# Patient Record
Sex: Female | Born: 1935 | Race: White | Hispanic: No | Smoking: Never smoker
Health system: Southern US, Community
[De-identification: ages and names within clinical notes are randomized; demographics above are authoritative.]

## PROBLEM LIST (undated history)

## (undated) DIAGNOSIS — H409 Unspecified glaucoma: Secondary | ICD-10-CM

## (undated) DIAGNOSIS — R1310 Dysphagia, unspecified: Secondary | ICD-10-CM

## (undated) DIAGNOSIS — M1990 Unspecified osteoarthritis, unspecified site: Secondary | ICD-10-CM

## (undated) DIAGNOSIS — E039 Hypothyroidism, unspecified: Secondary | ICD-10-CM

## (undated) DIAGNOSIS — I639 Cerebral infarction, unspecified: Secondary | ICD-10-CM

## (undated) DIAGNOSIS — J302 Other seasonal allergic rhinitis: Secondary | ICD-10-CM

## (undated) DIAGNOSIS — Z9889 Other specified postprocedural states: Secondary | ICD-10-CM

## (undated) DIAGNOSIS — Z86010 Personal history of colonic polyps: Secondary | ICD-10-CM

## (undated) HISTORY — DX: Cerebral infarction, unspecified: I63.9

## (undated) HISTORY — PX: THYROIDECTOMY: SHX17

## (undated) HISTORY — DX: Unspecified osteoarthritis, unspecified site: M19.90

## (undated) HISTORY — DX: Hypothyroidism, unspecified: E03.9

## (undated) HISTORY — PX: FRACTURE SURGERY: SHX138

---

## 1998-06-27 ENCOUNTER — Ambulatory Visit (HOSPITAL_COMMUNITY): Admission: RE | Admit: 1998-06-27 | Discharge: 1998-06-27 | Payer: Self-pay | Admitting: Family Medicine

## 1999-01-23 ENCOUNTER — Encounter (INDEPENDENT_AMBULATORY_CARE_PROVIDER_SITE_OTHER): Payer: Self-pay | Admitting: Specialist

## 1999-01-23 ENCOUNTER — Other Ambulatory Visit: Admission: RE | Admit: 1999-01-23 | Discharge: 1999-01-23 | Payer: Self-pay | Admitting: Gastroenterology

## 1999-01-24 ENCOUNTER — Ambulatory Visit (HOSPITAL_COMMUNITY): Admission: RE | Admit: 1999-01-24 | Discharge: 1999-01-24 | Payer: Self-pay | Admitting: Gastroenterology

## 1999-01-24 ENCOUNTER — Encounter (INDEPENDENT_AMBULATORY_CARE_PROVIDER_SITE_OTHER): Payer: Self-pay

## 1999-06-05 DIAGNOSIS — Z86010 Personal history of colonic polyps: Secondary | ICD-10-CM

## 1999-06-05 DIAGNOSIS — Z9889 Other specified postprocedural states: Secondary | ICD-10-CM

## 1999-06-05 HISTORY — DX: Other specified postprocedural states: Z98.890

## 1999-06-05 HISTORY — DX: Personal history of colonic polyps: Z86.010

## 1999-07-17 ENCOUNTER — Encounter: Payer: Self-pay | Admitting: Family Medicine

## 1999-07-17 ENCOUNTER — Ambulatory Visit (HOSPITAL_COMMUNITY): Admission: RE | Admit: 1999-07-17 | Discharge: 1999-07-17 | Payer: Self-pay | Admitting: Family Medicine

## 2000-03-06 ENCOUNTER — Encounter (INDEPENDENT_AMBULATORY_CARE_PROVIDER_SITE_OTHER): Payer: Self-pay | Admitting: Specialist

## 2000-03-06 ENCOUNTER — Ambulatory Visit (HOSPITAL_COMMUNITY): Admission: RE | Admit: 2000-03-06 | Discharge: 2000-03-06 | Payer: Self-pay | Admitting: Gastroenterology

## 2000-07-17 ENCOUNTER — Ambulatory Visit (HOSPITAL_COMMUNITY): Admission: RE | Admit: 2000-07-17 | Discharge: 2000-07-17 | Payer: Self-pay | Admitting: Family Medicine

## 2000-07-17 ENCOUNTER — Encounter: Payer: Self-pay | Admitting: Family Medicine

## 2001-07-29 ENCOUNTER — Ambulatory Visit (HOSPITAL_COMMUNITY): Admission: RE | Admit: 2001-07-29 | Discharge: 2001-07-29 | Payer: Self-pay | Admitting: Family Medicine

## 2001-07-29 ENCOUNTER — Encounter: Payer: Self-pay | Admitting: Internal Medicine

## 2002-08-04 ENCOUNTER — Ambulatory Visit (HOSPITAL_COMMUNITY): Admission: RE | Admit: 2002-08-04 | Discharge: 2002-08-04 | Payer: Self-pay | Admitting: Internal Medicine

## 2002-08-04 ENCOUNTER — Encounter: Payer: Self-pay | Admitting: Internal Medicine

## 2002-08-16 ENCOUNTER — Emergency Department (HOSPITAL_COMMUNITY): Admission: EM | Admit: 2002-08-16 | Discharge: 2002-08-16 | Payer: Self-pay | Admitting: Emergency Medicine

## 2002-08-16 ENCOUNTER — Encounter: Payer: Self-pay | Admitting: Emergency Medicine

## 2003-08-11 ENCOUNTER — Ambulatory Visit (HOSPITAL_COMMUNITY): Admission: RE | Admit: 2003-08-11 | Discharge: 2003-08-11 | Payer: Self-pay | Admitting: Internal Medicine

## 2007-06-05 HISTORY — PX: KYPHOPLASTY: SHX5884

## 2008-04-23 ENCOUNTER — Ambulatory Visit (HOSPITAL_COMMUNITY): Admission: RE | Admit: 2008-04-23 | Discharge: 2008-04-23 | Payer: Self-pay | Admitting: Internal Medicine

## 2008-04-27 ENCOUNTER — Encounter: Payer: Self-pay | Admitting: Internal Medicine

## 2008-04-28 ENCOUNTER — Ambulatory Visit (HOSPITAL_COMMUNITY): Admission: RE | Admit: 2008-04-28 | Discharge: 2008-04-28 | Payer: Self-pay | Admitting: Interventional Radiology

## 2008-04-28 ENCOUNTER — Encounter (INDEPENDENT_AMBULATORY_CARE_PROVIDER_SITE_OTHER): Payer: Self-pay | Admitting: Interventional Radiology

## 2008-05-13 ENCOUNTER — Encounter: Payer: Self-pay | Admitting: Interventional Radiology

## 2009-11-08 IMAGING — XA IR KYPHOPLASTY LUMBAR INIT
1 series · 12 of 24 positions shown · non-contrast
Comparison: MRI scan of the lumbosacral spine of 04/23/2008.

CLINICAL DATA: Low back pain secondary to compression fracture at
L3.

KYPHOPLASTY AT L3:

[Series 1: run · 12 of 77 slices shown]
[im 4/77]
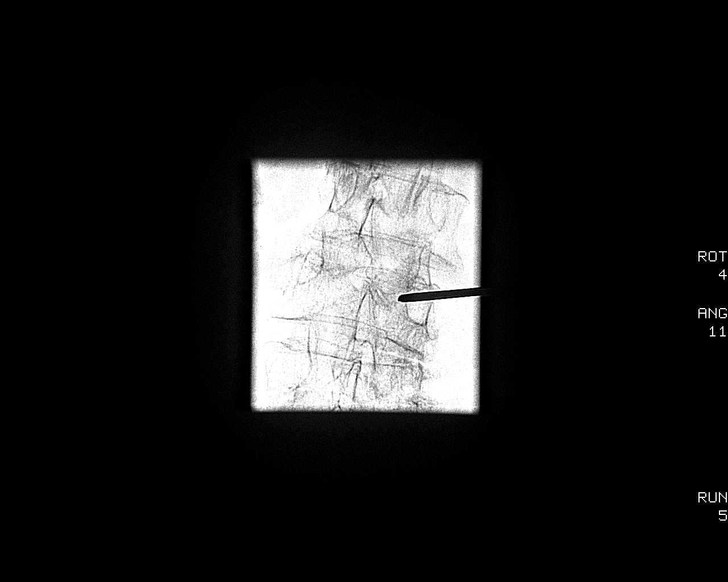
[im 10/77]
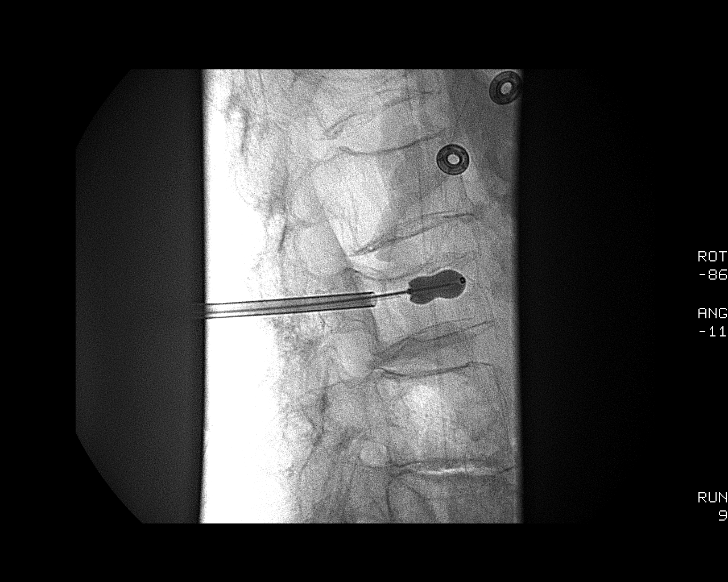
[im 17/77]
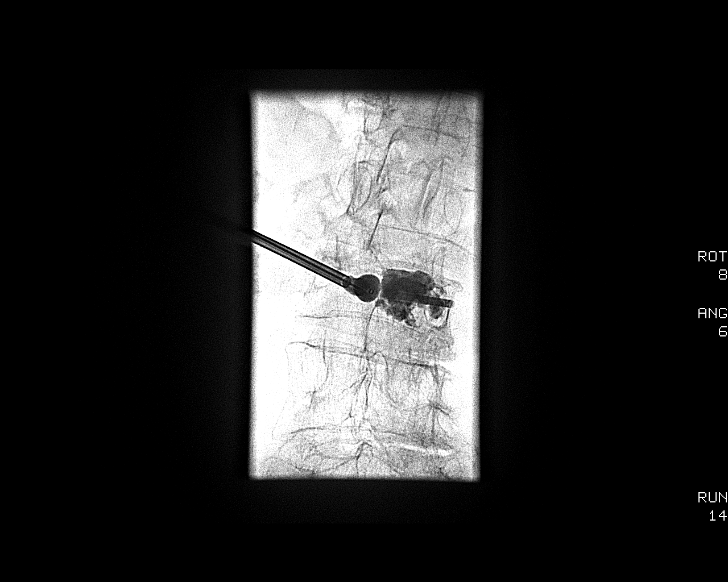
[im 24/77]
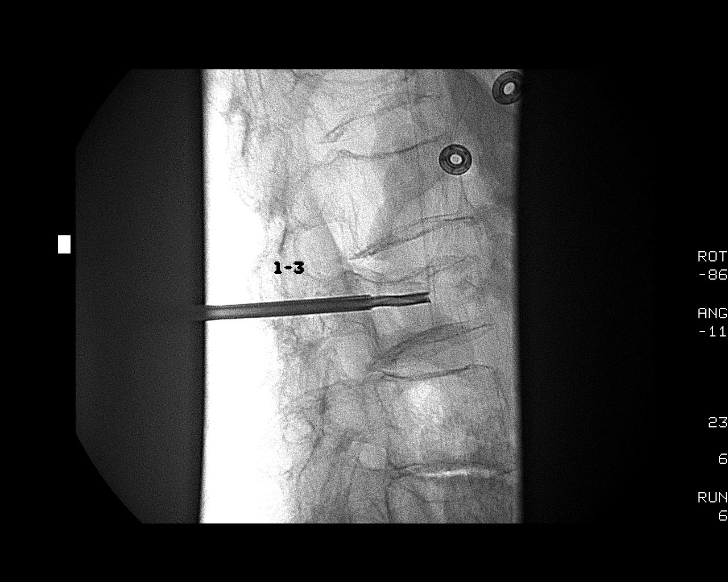
[im 30/77]
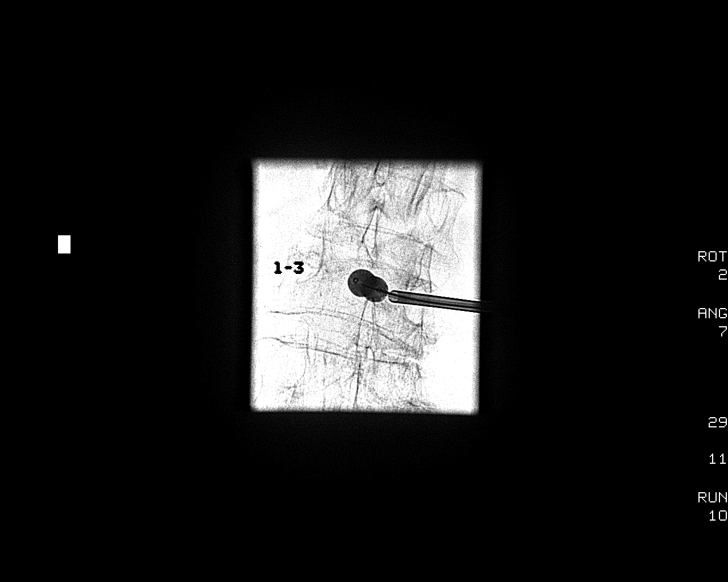
[im 37/77]
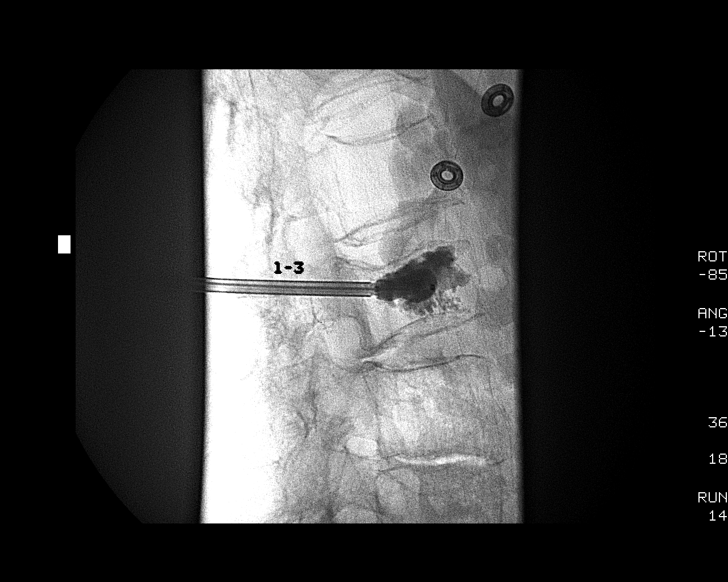
[im 43/77]
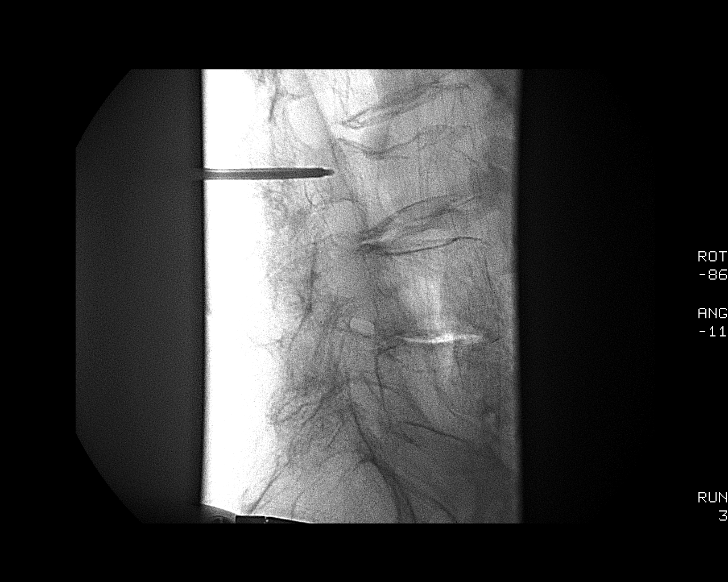
[im 50/77]
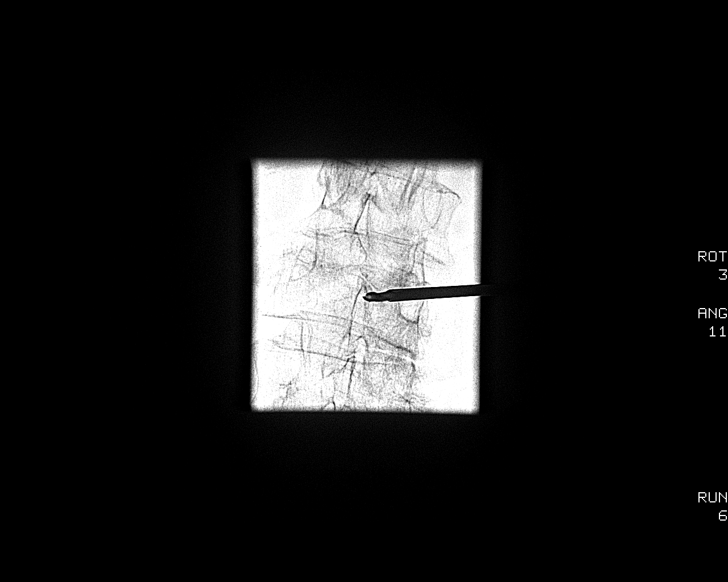
[im 57/77]
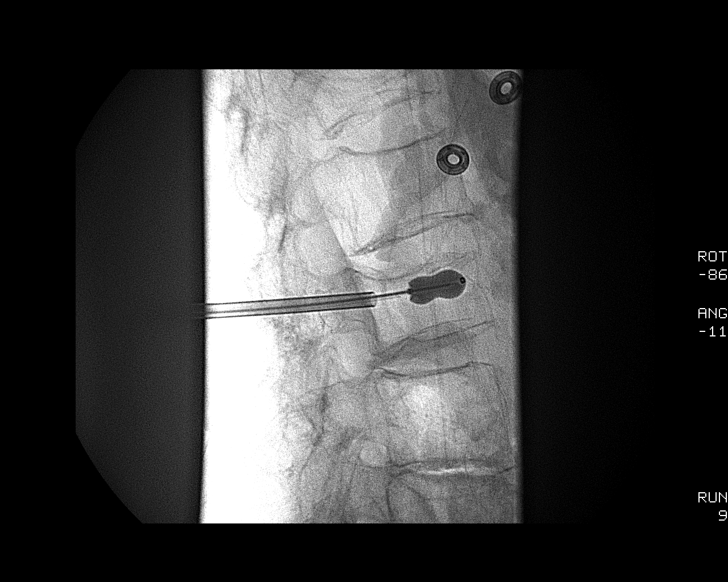
[im 63/77]
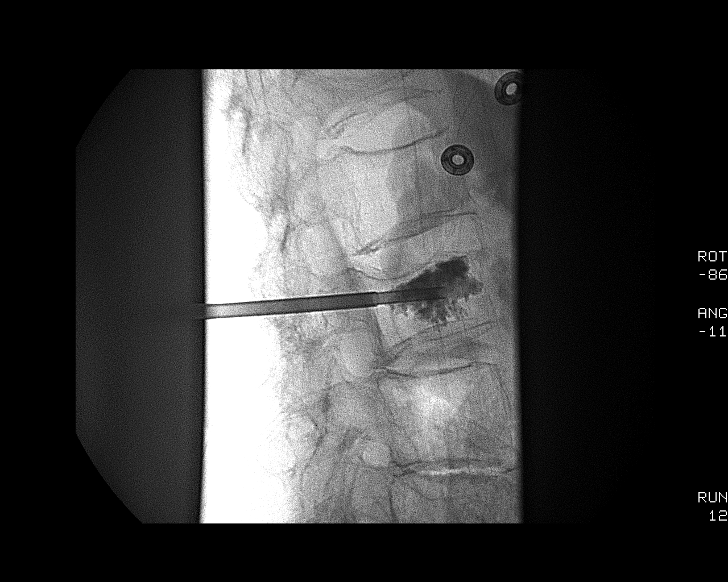
[im 70/77]
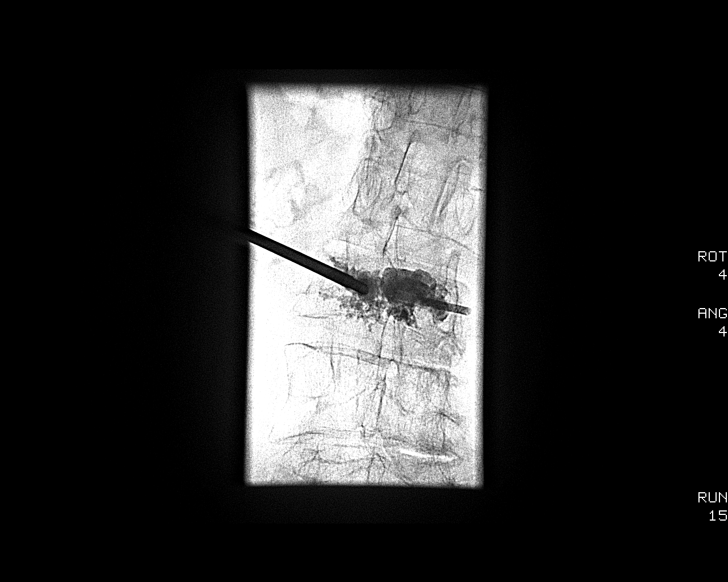
[im 77/77]
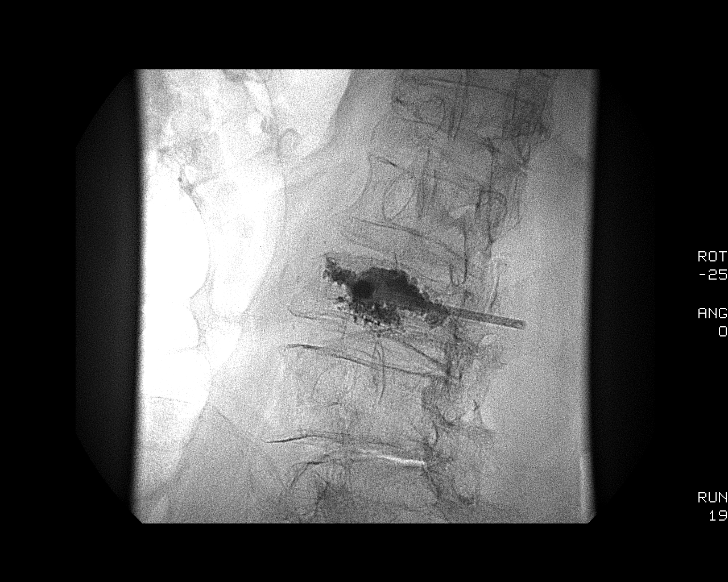

[12 of 24 positions shown; findings below may reference images not displayed]

Following a full explanation of the procedure along with the
potential associated complications, an informed witnessed consent
was obtained.

The patient was laid prone on the fluoroscopic table.  The skin
overlying the lumbar region was then prepped and draped in the
usual sterile fashion.  The right pedicle at L3 was identified and
infiltrated with 0.25% bupivacaine followed by the advancement of
an 11-gauge Jamshidi needle through the right pedicle into the
posterior half at L3.  Also inserted was a second Jamshidi needle
through the left pedicle at L3 as described above into the
posterior one-third at L3.

Both these were then exchanged out for the Kyphon advanced osteo
introducer system comprised of working cannulae and Kyphon osteo
drills over Kyphon osteo bone pins.  Both these combinations were
then advanced under biplane intermittent fluoroscopy until the
Kyphon osteo drills were in the posterior third at L3.

At this time the bone pins were removed.  In a medial trajectory,
both the working cannulae and the Kyphon osteo drills were advanced
medially until the tips of the working cannulae were in the
posterior one-third at L3.

The Kyphon osteo drills were removed and the core samples sent for
pathologic analysis.

Through both the working cannulae, Kyphon bone biopsy devices were
advanced to within 5 mm of L3 and biopsies from these were also
sent for pathologic analysis.

Through the working cannulae, Kyphon inflatable bone tamps 20 x 3
was then advanced and positioned such that the distal markers were
5 mm from the anterior aspect of L3.  Both these were then inflated
with contrast using a Kyphon inflation syringe device via
microtubing until there was apposition with the superior endplates.
Methylmethacrylate mixture was then reconstituted with Tobramycin
in the Kyphon bone mixing device system.  This was then loaded onto
the Kyphon bone fillers.  The balloons were then deflated and
removed followed by the instillation of 2.5 bone filler equivalents
via the right pedicle, and 2 bone filler equivalents via the left
pedicle.  There were no acute complications clinically.  The
patient tolerated the procedure well.  No extravasation was noted
into disc spaces or posteriorly into the spinal canal or into the
epidural venous structures.

The working cannulae were then retrieved and removed as were the
bone fillers.  Hemostasis was achieved at the skin entry sites
bilaterally.

Medications utilized:  Versed 3 mg IV and fentanyl 100 mcg IV.
IMPRESSION: 1.  Status post fluoroscopic-guided needle placement for deep core
bone biopsy at L3.
2.  Status post fluoroscopic guided needle placement for vertebral
body augmentation for painful osteoporotic compression fracture at
L3 using the balloon kyphoplasty technique.

## 2010-06-04 DIAGNOSIS — R1310 Dysphagia, unspecified: Secondary | ICD-10-CM

## 2010-06-04 HISTORY — DX: Dysphagia, unspecified: R13.10

## 2010-06-24 ENCOUNTER — Encounter: Payer: Self-pay | Admitting: Internal Medicine

## 2010-09-07 ENCOUNTER — Emergency Department (HOSPITAL_COMMUNITY)
Admission: EM | Admit: 2010-09-07 | Discharge: 2010-09-07 | Disposition: A | Discharge status: Home / Self Care | Payer: Medicare Other | Attending: Emergency Medicine | Admitting: Emergency Medicine

## 2010-09-07 ENCOUNTER — Emergency Department (HOSPITAL_COMMUNITY): Payer: Medicare Other

## 2010-09-07 DIAGNOSIS — IMO0002 Reserved for concepts with insufficient information to code with codable children: Secondary | ICD-10-CM | POA: Insufficient documentation

## 2010-09-07 DIAGNOSIS — T17308A Unspecified foreign body in larynx causing other injury, initial encounter: Secondary | ICD-10-CM | POA: Insufficient documentation

## 2010-09-07 DIAGNOSIS — R0602 Shortness of breath: Secondary | ICD-10-CM | POA: Insufficient documentation

## 2010-09-07 DIAGNOSIS — E039 Hypothyroidism, unspecified: Secondary | ICD-10-CM | POA: Insufficient documentation

## 2010-09-07 DIAGNOSIS — R059 Cough, unspecified: Secondary | ICD-10-CM | POA: Insufficient documentation

## 2010-09-07 DIAGNOSIS — R05 Cough: Secondary | ICD-10-CM | POA: Insufficient documentation

## 2010-09-13 ENCOUNTER — Other Ambulatory Visit (HOSPITAL_COMMUNITY): Payer: Self-pay | Admitting: Internal Medicine

## 2010-09-13 DIAGNOSIS — R059 Cough, unspecified: Secondary | ICD-10-CM

## 2010-09-13 DIAGNOSIS — R05 Cough: Secondary | ICD-10-CM

## 2010-09-13 DIAGNOSIS — J189 Pneumonia, unspecified organism: Secondary | ICD-10-CM

## 2010-09-14 ENCOUNTER — Other Ambulatory Visit (HOSPITAL_COMMUNITY): Payer: Self-pay | Admitting: Internal Medicine

## 2010-09-14 DIAGNOSIS — T17998A Other foreign object in respiratory tract, part unspecified causing other injury, initial encounter: Secondary | ICD-10-CM

## 2010-09-19 ENCOUNTER — Other Ambulatory Visit (HOSPITAL_COMMUNITY): Payer: Medicare Other

## 2010-09-19 ENCOUNTER — Ambulatory Visit (HOSPITAL_COMMUNITY): Payer: Medicare Other

## 2010-09-21 ENCOUNTER — Other Ambulatory Visit (HOSPITAL_COMMUNITY): Payer: Medicare Other

## 2010-09-21 ENCOUNTER — Ambulatory Visit (HOSPITAL_COMMUNITY): Payer: Medicare Other

## 2010-10-17 NOTE — Consult Note (Signed)
NAMEJOHNANNA, BAKKE               ACCOUNT NO.:  [id number]   MEDICAL RECORD NO.:  [id number]          PATIENT TYPE:  OUT   LOCATION:  XRAY                         FACILITY:  [facility]   PHYSICIAN:  Sanjeev K. Deveshwar, M.D.DATE OF BIRTH:  1908-01-13   DATE OF CONSULTATION:  DATE OF DISCHARGE:                                 CONSULTATION   CHIEF COMPLAINT:  Status post L3 kyphoplasty on April 28, 2008.   HISTORY OF PRESENT ILLNESS:  This is a very pleasant, active, alert 77-  year-old female who was referred to Dr. Corliss Skains through the courtesy  of Dr. Nicholos Johns for evaluation of an L3 compression fracture which  she believes she suffered when she was trying to get in and out of a  van.  The patient had significant pain.  She was seen in consultation by  Dr. Corliss Skains on April 27, 2008, and arrangements were made to have  the patient return on April 28, 2008, for the kyphoplasty.  The  patient returns today accompanied by 2 friends to be seen in followup.   PAST MEDICAL HISTORY:  1. The patient has a previous history of a CVA.  2. She has hypothyroidism.  3. She has a history of asthma.   SURGICAL HISTORY:  The patient is status post thyroidectomy and status  post hip surgery.   ALLERGIES:  No known drug allergies.   Current medications include Synthroid and aspirin.  She is not taking  any medications for pain at this time.   SOCIAL HISTORY:  The patient is widowed.  She has no children.  She  continues to live alone in [place].  She has very supportive friends  who look after her.  She does not use alcohol or tobacco.   FAMILY HISTORY:  Her mother died in her 46s from pneumonia.  Her father  died in his 51s, the cause was unknown.   IMPRESSION/PLAN:  As noted, the patient returns today accompanied by 2  friends to be seen in followup after undergoing an L3 kyphoplasty on  April 28, 2008, for a painful compression fracture.  Dr. Corliss Skains  reviewed the  results of the intervention with the patient and her  friends.  He showed them the images from the procedure.  He also pointed  out an old fracture at L1 that has healed.   As noted, the patient is pain free.  She is able to ambulate about her  house with a walker or a cane, sometimes without any assistive device,  whatsoever.  Dr. Corliss Skains did encourage her to use the walker or the  cane for greater stability.  The patient is very pleased with the  results of the kyphoplasty.   The patient has 2 friends who accompanied her today.  They are concerned  about the patient's ability to continue living alone.  She does appear  to be very alert and is able to prepare her own meals.  Dr. Corliss Skains  did not see any specific reason why she could not continue to live  alone.  We did encourage them to discuss this situation with  Dr.  Nicholos Johns, her primary care physician.   The patient's friends also mentioned that the patient had been having  some breast pain recently, a question whether or not the patient would  be a candidate for a mammogram.  Dr. Corliss Skains felt that there was no  reason not to order a mammogram in this active 49 year old.  He  encouraged the patient's friends to once again discuss this situation  with Dr. Nicholos Johns.   The patient was told to call if she developed any further back pain.  Greater than 10 minutes was spent on this followup visit.  Further  followup will be on a p.r.n. basis.      Delton See, P.A.    ______________________________  Grandville Silos. Corliss Skains, M.D.    DR/MEDQ  D:  05/13/2008  T:  05/14/2008  Job:  [id number]   cc:   Georgianne Fick, M.D.

## 2010-10-20 NOTE — Op Note (Signed)
[facility]  Patient:    Glenda Rogers, Glenda Rogers                        MRN: [id number] Proc. Date: 03/06/00 Adm. Date:  [id number] Attending:  Judeth Cornfield CC:         Olga Coaster, M.D.   Operative Report  PROCEDURE:  Upper endoscopy.  HISTORY:  Ms. Reznick had a 4 to 5 cm colon polyp in the sigmoid that was removed a year ago.  This is a followup examination.  INFORMED CONSENT:  The patient provided consent after risks, benefits, and alternatives were explained.  MEDICATIONS:  Versed 1.5 mg IV, Fentanyl 50 mcg, and Robinul 0.5 mg IV. DESCRIPTION OF PROCEDURE:  The patient was placed in the left lateral decubitus position, administered continuous low-flow oxygen, and was placed on pulse oximetry.  Because of the excellent prep, full colonoscopy was performed with ease.  The scope was then withdrawn from the cecum, and all areas of the colon were examined.  FINDINGS: 1. In the sigmoid at approximately 18 cm from the rectum, corresponding to the    previous polypectomy, there was about a 5 mm polyp remnant.  This was    removed via snare cautery and submitted to pathology. 2. There were multiple diverticulum in the sigmoid colon. 3. In the descending colon, approximately 50 cm from the anus, was a sessile    5 to 6 mm polyp. This was removed via snare and hot biopsy forceps and    submitted to pathology. 4. The remainder of the exam was normal including transverse, ascending colon,    and cecum.  IMPRESSION: 1. Colon polyps, removed. 2. Diverticulosis.  RECOMMENDATIONS:  In view of the patients advanced age, I do not think further surveillance colonoscopy is required.DD:  03/06/00 TD:  03/06/00 Job: [id number]

## 2010-10-26 ENCOUNTER — Ambulatory Visit
Admission: RE | Admit: 2010-10-26 | Discharge: 2010-10-26 | Disposition: A | Discharge status: Home / Self Care | Payer: Medicare Other | Source: Ambulatory Visit | Attending: Internal Medicine | Admitting: Internal Medicine

## 2010-10-26 ENCOUNTER — Other Ambulatory Visit: Payer: Self-pay | Admitting: Internal Medicine

## 2010-10-26 DIAGNOSIS — R05 Cough: Secondary | ICD-10-CM

## 2010-10-26 MED ORDER — IOHEXOL 300 MG/ML  SOLN
80.0000 mL | Freq: Once | INTRAMUSCULAR | Status: AC | PRN
  Administered 2010-10-26: 80 mL via INTRAVENOUS

## 2010-11-07 ENCOUNTER — Encounter: Payer: Self-pay | Admitting: Internal Medicine

## 2010-11-09 ENCOUNTER — Ambulatory Visit (INDEPENDENT_AMBULATORY_CARE_PROVIDER_SITE_OTHER): Payer: Medicare Other | Admitting: Internal Medicine

## 2010-11-09 ENCOUNTER — Encounter: Payer: Self-pay | Admitting: Internal Medicine

## 2010-11-09 ENCOUNTER — Other Ambulatory Visit (INDEPENDENT_AMBULATORY_CARE_PROVIDER_SITE_OTHER): Payer: Medicare Other

## 2010-11-09 VITALS — BP 116/64 | HR 68 | Temp 97.5°F | Ht 62.0 in | Wt 117.6 lb

## 2010-11-09 DIAGNOSIS — R0602 Shortness of breath: Secondary | ICD-10-CM

## 2010-11-09 DIAGNOSIS — J90 Pleural effusion, not elsewhere classified: Secondary | ICD-10-CM

## 2010-11-09 NOTE — Patient Instructions (Signed)
Have blood work today to see if fluid is from heart issue Most likely fluid is due to choking on salad Because you are getting better, repeat CXR PA and lateral in 6 weeks; depending on this we can get CT chest REturn after CXR in 6 weeks Make sure you do the swallow study under Dr. Carolyn Stare advice If you get worse or sick in between call us or come sooner

## 2010-11-09 NOTE — Progress Notes (Signed)
Subjective:    Patient ID: Glenda Rogers, female    DOB: 1907/10/30, 77 y.o.   MRN: [id number]  HPI 77 year old female (frail, uses walker, partially blind, cogent, no memory loss, lives with caregiver, unclear code status, on synthroid only, prior kyphoplasty). Caregiver noticing several months of on and off, intermittent, dysphagia. Then on 09/07/2010 acutely choked on salad (per hx and er records), turned blue and received heimlich but without return of foreign body though not witnessed to aspirate a foreign body. In ER CXR and neck xray normal apart from possible very small bilateral pleural effusion and sent home. Since then was having intermittent cough with still persistent dysphagia. Followed with Dr Nicholos Johns - WC 11.7k on 4/1/ PMD and they give hx of 1-2 antibiotic courses per hx. Since then cough slowly getting better and almost resolved. Dysphagia persists unchanged and she has refused swallow evaluation. Denies weight loss, night sweats, chills, rigors, sputum, edema or heart disease. Had CT chest 10/26/2010 and there is some rt mid zone air space disease along with small right pleural effusion that is not loculated   Review of Systems  Constitutional: Negative for fever and unexpected weight change.  HENT: Positive for ear pain and trouble swallowing. Negative for nosebleeds, congestion, sore throat, rhinorrhea, sneezing, dental problem, postnasal drip and sinus pressure.   Eyes: Negative for redness and itching.  Respiratory: Positive for cough and shortness of breath. Negative for chest tightness and wheezing.   Cardiovascular: Negative for palpitations and leg swelling.  Gastrointestinal: Negative for nausea and vomiting.  Genitourinary: Negative for dysuria.  Musculoskeletal: Negative for joint swelling.  Skin: Negative for rash.  Neurological: Negative for headaches.  Hematological: Does not bruise/bleed easily.  Psychiatric/Behavioral: Negative for dysphoric mood. The  patient is not nervous/anxious.        Objective:   Physical Exam  [vitalsreviewed. Constitutional: She is oriented to person, place, and time. She appears well-developed and well-nourished. No distress.       Very frail Sitting on wheel chair  HENT:  Head: Normocephalic and atraumatic.  Right Ear: External ear normal.  Left Ear: External ear normal.  Mouth/Throat: Oropharynx is clear and moist. No oropharyngeal exudate.  Eyes: Conjunctivae and EOM are normal. Pupils are equal, round, and reactive to light. Right eye exhibits no discharge. Left eye exhibits no discharge. No scleral icterus.       Partially blbinnd  Neck: Normal range of motion. Neck supple. No JVD present. No tracheal deviation present. No thyromegaly present.  Cardiovascular: Normal rate, regular rhythm, normal heart sounds and intact distal pulses.  Exam reveals no gallop and no friction rub.   No murmur heard. Pulmonary/Chest: Effort normal and breath sounds normal. No respiratory distress. She has no wheezes. She has no rales. She exhibits no tenderness.  Abdominal: Soft. Bowel sounds are normal. She exhibits no distension and no mass. There is no tenderness. There is no rebound and no guarding.  Musculoskeletal: Normal range of motion. She exhibits no edema and no tenderness.       kyphotic  Lymphadenopathy:    She has no cervical adenopathy.  Neurological: She is alert and oriented to person, place, and time. She has normal reflexes. No cranial nerve deficit. She exhibits normal muscle tone. Coordination normal.  Skin: Skin is warm and dry. No rash noted. She is not diaphoretic. No erythema. No pallor.  Psychiatric: She has a normal mood and affect. Her behavior is normal. Judgment and thought content normal.  Assessment & Plan:

## 2010-11-09 NOTE — Assessment & Plan Note (Signed)
I believe the right airspace disease and pleural effusion are reactive to foreign body aspiration. She has now improved with single antibiotic course. She is not symptomatic from it. So far she has not declared herself as recurrent pneumonia. CHF and malignancy remain in differential for pleural effusion but are less likely. Will check BNP. We discussed management. Diagnositc thoracentesis possible but she is clinically better and would subject her to needless risk. Best to follow clinically. It is more important she get swallow study (refused) . I will see her in 6 weeks with fu cxr. IF ever pleural effusion gets worse and causes symptoms, thoracentesis and pleurX cathtere are good palliative options (they do not know her code status clearly but my sense talking to her, dpoa and nurse is palliation is preferred choice of Rx)

## 2010-11-14 ENCOUNTER — Telehealth: Payer: Self-pay | Admitting: Internal Medicine

## 2010-11-14 NOTE — Telephone Encounter (Signed)
Ok, please order swallow study by speech for dysphagia evaluation

## 2010-11-14 NOTE — Telephone Encounter (Signed)
Spoke with pt's POA Rachel. She states that pt had a swallowing study scheduled, ordered by Dr Nicholos Johns and she ended up having to cancel this. Now that she wants to reschedule, b/c pt feels up to it, Dr Nicholos Johns will not order this and told her "you have to do it your self". She is upset and wants to know if [facility] would be willing to order this test for pt. Please advise thanks!

## 2010-11-15 NOTE — Telephone Encounter (Signed)
Spoke with pt's caregiver and advised that swallow eval order sent to [facility].

## 2010-11-20 ENCOUNTER — Ambulatory Visit (HOSPITAL_COMMUNITY)
Admission: RE | Admit: 2010-11-20 | Discharge: 2010-11-20 | Disposition: A | Discharge status: Home / Self Care | Payer: Medicare Other | Source: Ambulatory Visit | Attending: Internal Medicine | Admitting: Internal Medicine

## 2010-11-20 DIAGNOSIS — R1310 Dysphagia, unspecified: Secondary | ICD-10-CM | POA: Insufficient documentation

## 2010-11-26 ENCOUNTER — Telehealth: Payer: Self-pay | Admitting: Internal Medicine

## 2010-11-26 DIAGNOSIS — T17908A Unspecified foreign body in respiratory tract, part unspecified causing other injury, initial encounter: Secondary | ICD-10-CM

## 2010-11-26 NOTE — Telephone Encounter (Signed)
Candise Bowens, never mind  - just found the speech swallow eval report. She has esophageal dysphagia which they think is cause of aspiration. Please refer to GI and give patient result

## 2010-11-29 NOTE — Telephone Encounter (Signed)
I spoke with the patient and she is aware of results and order sent to [facility] for GI appt. Carron Curie, CMA

## 2010-11-30 ENCOUNTER — Telehealth: Payer: Self-pay | Admitting: Gastroenterology

## 2010-11-30 NOTE — Telephone Encounter (Signed)
Pt scheduled to see Dr. Arlyce Dice 12/25/10@2 :30pm. Almyra Free to notify pt of appt date and time.

## 2010-12-25 ENCOUNTER — Ambulatory Visit (INDEPENDENT_AMBULATORY_CARE_PROVIDER_SITE_OTHER): Payer: Medicare Other | Admitting: Gastroenterology

## 2010-12-25 ENCOUNTER — Encounter: Payer: Self-pay | Admitting: Gastroenterology

## 2010-12-25 DIAGNOSIS — Z860100 Personal history of colon polyps, unspecified: Secondary | ICD-10-CM | POA: Insufficient documentation

## 2010-12-25 DIAGNOSIS — R066 Hiccough: Secondary | ICD-10-CM

## 2010-12-25 DIAGNOSIS — R1310 Dysphagia, unspecified: Secondary | ICD-10-CM

## 2010-12-25 NOTE — Assessment & Plan Note (Addendum)
Symptoms could be due to esophageal reflux. A partial esophageal obstruction should be ruled out.  Recommendations #1 barium swallow

## 2010-12-25 NOTE — Patient Instructions (Signed)
Your Barium Swallow is scheduled at [facility] on 12/27/2010 at 10:30. You will need to arrive at 10:15 If you need to reschedule this appointment please contact Radiology at [phone]

## 2010-12-25 NOTE — Progress Notes (Signed)
History of Present Illness:  Glenda Rogers is a 77 year old white female referred at the request of Dr.Ramachandran for evaluation of swallowing difficulties. Several weeks ago she had some choking when she swallowed solid foods which she attributes to sneezing at the same time as swallowing. Since that time she's had no further dysphagia but claims to have hiccups with eating. She denies pyrosis, per se. She also denies odynophagia.  Patient has history of colon polyps he was last examined 2001.    Review of Systems: She has no lower extremity weakness requiring a walker. She frequently has a nocturnal nonproductive cough. Pertinent positive and negative review of systems were noted in the above HPI section. All other review of systems were otherwise negative.    Current Medications, Allergies, Past Medical History, Past Surgical History, Family History and Social History were reviewed in [employer] [facility] electronic medical record  Vital signs were reviewed in today's medical record. Physical Exam: General: Well developed , well nourished, no acute distress Head: Normocephalic and atraumatic Eyes:  sclerae anicteric, EOMI Ears: Normal auditory acuity Mouth: No deformity or lesions Lungs: There are a few bibasilar crisp rales Heart: Regular rate and rhythm; no murmurs, rubs or bruits Abdomen: Soft, non tender and non distended. No masses, hepatosplenomegaly or hernias noted. Normal Bowel sounds Rectal:deferred Musculoskeletal: Symmetrical with no gross deformities  Pulses:  Normal pulses noted Extremities: No clubbing, cyanosis, edema or deformities noted Neurological: Alert oriented x 4, grossly nonfocal Psychological:  Alert and cooperative. Normal mood and affect

## 2010-12-26 ENCOUNTER — Encounter: Payer: Self-pay | Admitting: Internal Medicine

## 2010-12-26 ENCOUNTER — Ambulatory Visit (INDEPENDENT_AMBULATORY_CARE_PROVIDER_SITE_OTHER): Payer: Medicare Other | Admitting: Internal Medicine

## 2010-12-26 ENCOUNTER — Ambulatory Visit (INDEPENDENT_AMBULATORY_CARE_PROVIDER_SITE_OTHER)
Admission: RE | Admit: 2010-12-26 | Discharge: 2010-12-26 | Disposition: A | Discharge status: Home / Self Care | Payer: Medicare Other | Source: Ambulatory Visit | Attending: Internal Medicine | Admitting: Internal Medicine

## 2010-12-26 VITALS — BP 128/68 | HR 71 | Temp 98.2°F | Wt 117.2 lb

## 2010-12-26 DIAGNOSIS — J90 Pleural effusion, not elsewhere classified: Secondary | ICD-10-CM

## 2010-12-26 NOTE — Progress Notes (Signed)
  Subjective:    Patient ID: Glenda Rogers, female    DOB: Dec 22, 1907, 77 y.o.   MRN: [id number]  HPI  FOllowup for pulmonary infiltrates following choking episodies. Since last visit 11/09/2010, doing well. Denies cough, dyspnea, chst pain, dysphagia, choking.  Had modified barium on 6/18 and showed primary esophageal dysphagia. SAw GI Dr. Arlyce Dice yesterday; he has scheduled barium swallow for tomorrow. CXR today is clear and shows resolution of infiltrates  Past, Family and Social reviewed: no changes noted  Review of Systems  Constitutional: Negative for fever and unexpected weight change.  HENT: Negative for ear pain, nosebleeds, congestion, sore throat, rhinorrhea, sneezing, trouble swallowing, dental problem, postnasal drip and sinus pressure.   Eyes: Negative for redness and itching.  Respiratory: Negative for cough, chest tightness, shortness of breath and wheezing.   Cardiovascular: Negative for palpitations and leg swelling.  Gastrointestinal: Negative for nausea and vomiting.  Genitourinary: Negative for dysuria.  Musculoskeletal: Negative for joint swelling.  Skin: Negative for rash.  Neurological: Negative for headaches.  Hematological: Does not bruise/bleed easily.  Psychiatric/Behavioral: Negative for dysphoric mood. The patient is not nervous/anxious.        Objective:   Physical Exam    Physical Exam  [vitalsreviewed. Constitutional: She is oriented to person, place, and time. She appears well-developed and well-nourished. No distress.       Very frail Sitting on wheel chair  HENT:  Head: Normocephalic and atraumatic.  Right Ear: External ear normal.  Left Ear: External ear normal.  Mouth/Throat: Oropharynx is clear and moist. No oropharyngeal exudate.  Eyes: Conjunctivae and EOM are normal. Pupils are equal, round, and reactive to light. Right eye exhibits no discharge. Left eye exhibits no discharge. No scleral icterus.       Partially blind  Neck: Normal range  of motion. Neck supple. No JVD present. No tracheal deviation present. No thyromegaly present.  Cardiovascular: Normal rate, regular rhythm, normal heart sounds and intact distal pulses.  Exam reveals no gallop and no friction rub.   No murmur heard. Pulmonary/Chest: Effort normal and breath sounds normal. No respiratory distress. She has no wheezes. She has no rales. She exhibits no tenderness.  Abdominal: Soft. Bowel sounds are normal. She exhibits no distension and no mass. There is no tenderness. There is no rebound and no guarding.  Musculoskeletal: Normal range of motion. She exhibits no edema and no tenderness.       kyphotic  Lymphadenopathy:    She has no cervical adenopathy.  Neurological: She is alert and oriented to person, place, and time. She has normal reflexes. No cranial nerve deficit. She exhibits normal muscle tone. Coordination normal.  Skin: Skin is warm and dry. No rash noted. She is not diaphoretic. No erythema. No pallor.  Psychiatric: She has a normal mood and affect. Her behavior is normal. Judgment and thought content normal.       Assessment & Plan:

## 2010-12-26 NOTE — Patient Instructions (Addendum)
Lung cxr does not show fluid reaccumulation No further need to come to [facility] clinic My nurse will give you details on barium swallow test tomorrow

## 2010-12-27 ENCOUNTER — Ambulatory Visit (HOSPITAL_COMMUNITY)
Admission: RE | Admit: 2010-12-27 | Discharge: 2010-12-27 | Disposition: A | Discharge status: Home / Self Care | Payer: Medicare Other | Source: Ambulatory Visit | Attending: Gastroenterology | Admitting: Gastroenterology

## 2011-01-02 ENCOUNTER — Telehealth: Payer: Self-pay | Admitting: *Deleted

## 2011-01-02 NOTE — Telephone Encounter (Signed)
Message copied by Marlowe Kays on Tue Jan 02, 2011  4:36 PM ------      Message from: Melvia Heaps MD D      Created: Thu Dec 28, 2010  8:12 AM      Regarding: RE: PT REFUSED BARIUM ESOPHOGRAM       Call and ask her if she will reconsider      ----- Message -----         From: Merri Ray, CMA         Sent: 12/27/2010   3:16 PM           To: Louis Meckel, MD      Subject: PT REFUSED BARIUM ESOPHOGRAM                             FYI DR Arlyce Dice, PT WENT FOR HER TEST TODAY, THE BARIUM ESOPHOGRAM... THEN REFUSED THE TEST.Marland KitchenMarland KitchenMarland Kitchen

## 2011-01-02 NOTE — Telephone Encounter (Signed)
Pt needs to have the test, need to see if she will change her mind

## 2011-01-16 NOTE — Telephone Encounter (Signed)
SPOKE WITH PT. SHE DOES NOT WANT TO HAVE THIS TEST DONE.

## 2011-01-16 NOTE — Telephone Encounter (Signed)
ok 

## 2011-01-19 NOTE — Assessment & Plan Note (Signed)
Resolved on cxr. Asymptomatic from pulmonary standpoint. No further followup planned

## 2011-03-07 LAB — CBC
MCHC: 33.5
Platelets: 183
RDW: 14.3

## 2011-03-07 LAB — BASIC METABOLIC PANEL
CO2: 31
Calcium: 9.4
GFR calc Af Amer: 55 — ABNORMAL LOW
Potassium: 4.5

## 2011-03-07 LAB — APTT: aPTT: 31

## 2011-11-11 ENCOUNTER — Emergency Department (HOSPITAL_COMMUNITY): Payer: Medicare Other

## 2011-11-11 ENCOUNTER — Observation Stay (HOSPITAL_COMMUNITY)
Admission: EM | Admit: 2011-11-11 | Discharge: 2011-11-13 | Disposition: A | Discharge status: Home / Self Care | Payer: Medicare Other | Source: Ambulatory Visit | Attending: Internal Medicine | Admitting: Internal Medicine

## 2011-11-11 DIAGNOSIS — Y92009 Unspecified place in unspecified non-institutional (private) residence as the place of occurrence of the external cause: Secondary | ICD-10-CM

## 2011-11-11 DIAGNOSIS — S32609A Unspecified fracture of unspecified ischium, initial encounter for closed fracture: Principal | ICD-10-CM | POA: Insufficient documentation

## 2011-11-11 DIAGNOSIS — E039 Hypothyroidism, unspecified: Secondary | ICD-10-CM | POA: Insufficient documentation

## 2011-11-11 DIAGNOSIS — W07XXXA Fall from chair, initial encounter: Secondary | ICD-10-CM | POA: Insufficient documentation

## 2011-11-11 DIAGNOSIS — Z8673 Personal history of transient ischemic attack (TIA), and cerebral infarction without residual deficits: Secondary | ICD-10-CM | POA: Insufficient documentation

## 2011-11-11 DIAGNOSIS — W19XXXA Unspecified fall, initial encounter: Secondary | ICD-10-CM

## 2011-11-11 DIAGNOSIS — M25559 Pain in unspecified hip: Secondary | ICD-10-CM | POA: Insufficient documentation

## 2011-11-11 DIAGNOSIS — S329XXA Fracture of unspecified parts of lumbosacral spine and pelvis, initial encounter for closed fracture: Secondary | ICD-10-CM | POA: Diagnosis present

## 2011-11-11 DIAGNOSIS — S32613A Displaced avulsion fracture of unspecified ischium, initial encounter for closed fracture: Secondary | ICD-10-CM

## 2011-11-11 NOTE — ED Notes (Signed)
ZOX:WRUE<AV> Expected date:11/11/11<BR> Expected time: 6:10 PM<BR> Means of arrival:Ambulance<BR> Comments:<BR> M41. 89 f. Fall, hip pain. No obvious injuries noted. 10-15 mins. *wheelchair-capable*

## 2011-11-11 NOTE — ED Notes (Signed)
Per EMS, Pt present with c/o fall.  Pt states "I sat down really hard"  Pt fall on right leg witnessed by caretaker.  Per ems, pt/caretaker denies any trauma.  Pt aaox3.  No deformities noted.  Pt denies pain at this time

## 2011-11-11 NOTE — ED Provider Notes (Signed)
History     CSN: [id number]  Arrival date & time 11/11/11  1836   First MD Initiated Contact with Patient 11/11/11 2011      Chief Complaint  Patient presents with  . Fall    (Consider location/radiation/quality/duration/timing/severity/associated sxs/prior treatment) HPI Patient suffered a ground-level fall when she fell out of a chair medially prior to coming here. Complains of right hip pain denies other complaint pain is worse with moving her right lower extremity no other complaint no treatment prior to coming here Past Medical History  Diagnosis Date  . Hypothyroidism   . CVA (cerebral infarction)     Past Surgical History  Procedure Date  . Fracture surgery     pelvis  . Thyroidectomy     Family History  Problem Relation Age of Onset  . Other Mother     pneumonia  . Colon cancer Neg Hx     History  Substance Use Topics  . Smoking status: Never Smoker   . Smokeless tobacco: Never Used  . Alcohol Use: No    OB History    Grav Para Term Preterm Abortions TAB SAB Ect Mult Living                  Review of Systems  Constitutional: Negative.   HENT: Negative.   Respiratory: Negative.   Cardiovascular: Negative.   Gastrointestinal: Negative.   Musculoskeletal: Positive for arthralgias.       Right hip pain  Skin: Negative.   Neurological: Negative.   Hematological: Negative.   Psychiatric/Behavioral: Negative.     Allergies  Amoxicillin  Home Medications   Current Outpatient Rx  Name Route Sig Dispense Refill  . ASPIRIN 325 MG PO TABS Oral Take 325 mg by mouth daily.      Marland Kitchen CALCIUM 600 + D PO Oral Take 1 tablet by mouth daily.     Marland Kitchen SYNTHROID 50 MCG PO TABS Oral Take 1 tablet by mouth Daily.    . TRAVATAN Z 0.004 % OP SOLN Both Eyes Place 2 drops into both eyes Daily.      BP 161/62  Pulse 75  Temp(Src) 98.2 F (36.8 C) (Oral)  Resp 23  SpO2 96%  Physical Exam  Nursing note and vitals reviewed. Constitutional: She appears  well-developed and well-nourished.  HENT:  Head: Normocephalic and atraumatic.  Eyes: Conjunctivae are normal. Pupils are equal, round, and reactive to light.  Neck: Neck supple. No tracheal deviation present. No thyromegaly present.  Cardiovascular: Normal rate and regular rhythm.   No murmur heard. Pulmonary/Chest: Effort normal and breath sounds normal.  Abdominal: Soft. Bowel sounds are normal. She exhibits no distension. There is no tenderness.  Musculoskeletal: She exhibits tenderness. She exhibits no edema.       Moves all extremities right lower extremity no deformity. Positive pain on rotation of thigh. DP pulse 2+ all extremities without redness or tenderness neurovascularly intact  Neurological: She is alert. No cranial nerve deficit. Coordination normal.  Skin: Skin is warm and dry. No rash noted.  Psychiatric: She has a normal mood and affect.    ED Course  Procedures (including critical care time)  Labs Reviewed - No data to display No results found. Results for orders placed in visit on 11/09/10  BRAIN NATRIURETIC PEPTIDE      Component Value Range   Pro B Natriuretic peptide (BNP) 117.0 (*) 0.0 - 100.0 (pg/mL)   Dg Hip Complete Right  11/11/2011  *RADIOLOGY REPORT*  Clinical Data:  Fall with right hip pain.  RIGHT HIP - COMPLETE 2+ VIEW  Comparison: None.  Findings: Moderate osteopenia.  Prior left proximal femoral fixation.  Both femoral heads are located.  There has been vertebral augmentation within the lower lumbar spine.  Lower lumbar spondylosis.  Dedicated views of the left hip demonstrate no convincing evidence of femur fracture.  Vascular calcifications.  IMPRESSION: No acute osseous abnormality.  Given the severity of osteopenia, if there is a strong clinical concern of fracture, consider further evaluation with MRI.  Original Report Authenticated By: Consuello Bossier, M.D.    No diagnosis found. X-ray reviewed by me Declines pain medicine MDM  Concern for  occult hip fracture MRI recommended. In light of MRI not available here transfer to [facility] emergency department for MRI scan of hip to rule out occult fracture Dr.Racour is accepting MD Diagnosis #1 fall #2 right hip pain     Doug Sou, MD 11/11/11 2236

## 2011-11-11 NOTE — ED Notes (Signed)
Report called to [facility], 10 min ETA

## 2011-11-12 ENCOUNTER — Emergency Department (HOSPITAL_COMMUNITY): Payer: Medicare Other

## 2011-11-12 ENCOUNTER — Encounter (HOSPITAL_COMMUNITY): Payer: Self-pay | Admitting: *Deleted

## 2011-11-12 DIAGNOSIS — M25559 Pain in unspecified hip: Secondary | ICD-10-CM

## 2011-11-12 DIAGNOSIS — S329XXA Fracture of unspecified parts of lumbosacral spine and pelvis, initial encounter for closed fracture: Secondary | ICD-10-CM | POA: Diagnosis present

## 2011-11-12 DIAGNOSIS — E119 Type 2 diabetes mellitus without complications: Secondary | ICD-10-CM

## 2011-11-12 DIAGNOSIS — E032 Hypothyroidism due to medicaments and other exogenous substances: Secondary | ICD-10-CM

## 2011-11-12 DIAGNOSIS — E039 Hypothyroidism, unspecified: Secondary | ICD-10-CM | POA: Diagnosis present

## 2011-11-12 DIAGNOSIS — W19XXXA Unspecified fall, initial encounter: Secondary | ICD-10-CM

## 2011-11-12 LAB — POCT I-STAT, CHEM 8
Calcium, Ion: 1.2 mmol/L (ref 1.12–1.32)
Creatinine, Ser: 1.1 mg/dL (ref 0.50–1.10)
Glucose, Bld: 120 mg/dL — ABNORMAL HIGH (ref 70–99)
HCT: 41 % (ref 36.0–46.0)
Hemoglobin: 13.9 g/dL (ref 12.0–15.0)
TCO2: 25 mmol/L (ref 0–100)

## 2011-11-12 MED ORDER — PANTOPRAZOLE SODIUM 40 MG PO TBEC
40.0000 mg | DELAYED_RELEASE_TABLET | Freq: Every day | ORAL | Status: DC
  Administered 2011-11-12 – 2011-11-13 (×2): 40 mg via ORAL
  Filled 2011-11-12 (×2): qty 1

## 2011-11-12 MED ORDER — SODIUM CHLORIDE 0.9 % IV SOLN: 250.0000 mL | INTRAVENOUS | Status: DC | PRN

## 2011-11-12 MED ORDER — SODIUM CHLORIDE 0.9 % IJ SOLN: 3.0000 mL | INTRAMUSCULAR | Status: DC | PRN

## 2011-11-12 MED ORDER — SODIUM CHLORIDE 0.9 % IJ SOLN
3.0000 mL | Freq: Two times a day (BID) | INTRAMUSCULAR | Status: DC
  Administered 2011-11-12 – 2011-11-13 (×3): 3 mL via INTRAVENOUS

## 2011-11-12 MED ORDER — LEVOTHYROXINE SODIUM 50 MCG PO TABS
50.0000 ug | ORAL_TABLET | Freq: Every day | ORAL | Status: DC
  Administered 2011-11-12 – 2011-11-13 (×2): 50 ug via ORAL
  Filled 2011-11-12 (×3): qty 1

## 2011-11-12 MED ORDER — ACETAMINOPHEN 650 MG RE SUPP: 650.0000 mg | Freq: Four times a day (QID) | RECTAL | Status: DC | PRN

## 2011-11-12 MED ORDER — ASPIRIN 325 MG PO TABS
325.0000 mg | ORAL_TABLET | Freq: Every day | ORAL | Status: DC
  Filled 2011-11-12: qty 1

## 2011-11-12 MED ORDER — TRAVOPROST (BAK FREE) 0.004 % OP SOLN
2.0000 [drp] | Freq: Every day | OPHTHALMIC | Status: DC
  Administered 2011-11-12: 2 [drp] via OPHTHALMIC
  Filled 2011-11-12: qty 2.5

## 2011-11-12 MED ORDER — NAPROXEN 375 MG PO TABS: 375.0000 mg | ORAL_TABLET | Freq: Two times a day (BID) | ORAL | Status: DC

## 2011-11-12 MED ORDER — ACETAMINOPHEN 325 MG PO TABS: 650.0000 mg | ORAL_TABLET | Freq: Four times a day (QID) | ORAL | Status: DC | PRN

## 2011-11-12 MED ORDER — ASPIRIN EC 81 MG PO TBEC
81.0000 mg | DELAYED_RELEASE_TABLET | Freq: Every day | ORAL | Status: DC
  Administered 2011-11-12 – 2011-11-13 (×2): 81 mg via ORAL
  Filled 2011-11-12 (×2): qty 1

## 2011-11-12 MED ORDER — HYDROCODONE-ACETAMINOPHEN 5-325 MG PO TABS: 1.0000 | ORAL_TABLET | ORAL | Status: DC | PRN

## 2011-11-12 NOTE — ED Notes (Signed)
Patient returned from MRI.

## 2011-11-12 NOTE — ED Notes (Signed)
EDP at bedside  

## 2011-11-12 NOTE — Progress Notes (Signed)
Utilization review completed. Natasha Paulson, RN, BSN. 11/12/11 

## 2011-11-12 NOTE — Progress Notes (Signed)
CARE MANAGEMENT NOTE 11/12/2011  Patient:  Glenda Rogers,Glenda Rogers   Account Number:  [id number]  Date Initiated:  11/12/2011  Documentation initiated by:  Vance Peper  Subjective/Objective Assessment:   78 yr old female admitted s/p fall. has ishial fracture- no surgery planned.     Action/Plan:   CM spoke with patient's POA- Fleet Contras [phone]. She states pt has 24/7 private sitters but they cant assist her in her current state. Wants SNF. Social Worker will see. Pt is observation. Will offer HH/PT/OT/NA/SW.   Anticipated DC Date:  11/13/2011   Anticipated DC Plan:  HOME W HOME HEALTH SERVICES      DC Planning Services  CM consult      [facility] Choice  HOME HEALTH   Choice offered to / List presented to:  C-2 HC POA / Guardian           Status of service:  In process, will continue to follow

## 2011-11-12 NOTE — ED Provider Notes (Signed)
Discussed mri results with Dr. Caryl Pina.  Discussed results with patient.  Patient tender over right ischial tuberosity and has difficulty rolling in bed due to pain.  Plan ortho consult and admit for inability to ambulate.  Patient lives alone alhtough she has caretakers who come in.    Hilario Quarry, MD 11/12/11 [phone]

## 2011-11-12 NOTE — H&P (Signed)
PCP:   Georgianne Fick, MD, MD   Chief Complaint:  Fall and hip pain  HPI: Very healthy 78 year old female with only a history of hypothyroidism who and walks with a walker is doing something today without her walker and knocked off some things off of the table. When she gets to bend down and pick it up she lost her balance and fell and hurt her hip. She went to [place] first. Initial x-ray was negative. So she was transferred to [facility], for MRI tonight. MRI shows an avulsion fracture with hip fracture. We are being asked to admit the patient because she is having uncontrolled pain with movement. She does have 24-hour sitter at home and is highly functioning.  Review of Systems:  Otherwise negative  Past Medical History: Past Medical History  Diagnosis Date  . Hypothyroidism   . CVA (cerebral infarction)    Past Surgical History  Procedure Date  . Fracture surgery     pelvis  . Thyroidectomy     Medications: Prior to Admission medications   Medication Sig Start Date End Date Taking? Authorizing Provider  aspirin 325 MG tablet Take 325 mg by mouth daily.     Yes Historical Provider, MD  Calcium Carbonate-Vitamin D (CALCIUM 600 + D PO) Take 1 tablet by mouth daily.    Yes Historical Provider, MD  SYNTHROID 50 MCG tablet Take 1 tablet by mouth Daily. 09/01/10  Yes Historical Provider, MD  TRAVATAN Z 0.004 % ophthalmic solution Place 2 drops into both eyes Daily. 12/21/10  Yes Historical Provider, MD    Allergies:   Allergies  Allergen Reactions  . Amoxicillin Rash    Social History:  reports that she has never smoked. She has never used smokeless tobacco. She reports that she does not drink alcohol or use illicit drugs.  Family History: Family History  Problem Relation Age of Onset  . Other Mother     pneumonia  . Colon cancer Neg Hx     Physical Exam: Filed Vitals:   11/11/11 1839 11/11/11 2259 11/12/11 0011 11/12/11 0204  BP: 161/62 144/56 151/68 146/64    Pulse: 75 75 82   Temp: 98.2 F (36.8 C)  98.6 F (37 C)   TempSrc: Oral  Oral   Resp: 23 18 22 20   SpO2: 96% 97% 95% 96%   General appearance: alert, cooperative, appears stated age and no distress Lungs: clear to auscultation bilaterally Heart: regular rate and rhythm, S1, S2 normal, no murmur, click, rub or gallop Abdomen: soft, non-tender; bowel sounds normal; no masses,  no organomegaly Pelvic: minimal pain with movemebt pain rt hip Extremities: extremities normal, atraumatic, no cyanosis or edema Skin: Skin color, texture, turgor normal. No rashes or lesions Neurologic: Grossly normal    Labs on Admission:   [facility] 11/12/11 0309  NA 142  K 4.3  CL 106  CO2 --  GLUCOSE 120*  BUN 25*  CREATININE 1.10  CALCIUM --  MG --  PHOS --    Basename 11/12/11 0309  WBC --  NEUTROABS --  HGB 13.9  HCT 41.0  MCV --  PLT --   Radiological Exams on Admission: Dg Hip Complete Right  11/11/2011  *RADIOLOGY REPORT*  Clinical Data: Fall with right hip pain.  RIGHT HIP - COMPLETE 2+ VIEW  Comparison: None.  Findings: Moderate osteopenia.  Prior left proximal femoral fixation.  Both femoral heads are located.  There has been vertebral augmentation within the lower lumbar spine.  Lower lumbar spondylosis.  Dedicated views of the left hip demonstrate no convincing evidence of femur fracture.  Vascular calcifications.  IMPRESSION: No acute osseous abnormality.  Given the severity of osteopenia, if there is a strong clinical concern of fracture, consider further evaluation with MRI.  Original Report Authenticated By: Consuello Bossier, M.D.   Mr Hip Right Wo Contrast  11/12/2011  **ADDENDUM** CREATED: 11/12/2011 03:08:34  The body of the report inadvertently references the left ischial tuberosity.  The abnormality is the right ischial tuberosity.  **END ADDENDUM** SIGNED BY: Waneta Martins, M.D.   11/12/2011  *RADIOLOGY REPORT*  Clinical Data: Right hip pain status post fall  MRI OF THE  RIGHT HIP WITHOUT CONTRAST  Technique:  Multiplanar, multisequence MR imaging was performed. No intravenous contrast was administered.  Comparison: 11/11/2011 radiograph  Findings: No right femoral neck fracture identified.  The femoral head is seated within the acetabulum.  Left hip arthroplasty is noted.  There is a significant amount of edema in the musculature of the posterior thigh. There is marrow edema involving the left ischial tuberosity where there is an avulsion fracture at the hamstring origin.  Associated tendon thickening / edema.  Colonic diverticulosis.  Mild presacral edema is nonspecific.  The sacrum is incompletely imaged.  IMPRESSION: Findings are most in keeping with a right proximal hamstring injury / osseous avulsion.  Discussed via telephone with Dr. Rosalia Hammers at 02:00 a.m. on 11/12/2011. Original Report Authenticated By: Waneta Martins, M.D.    Assessment/Plan Present on Admission:  78 year old female status post mechanical fall with pelvic fracture  .Avulsion fracture of pelvis .Hypothyroidism  We'll observe the patient overnight. Orthopedic surgery has already been called and will see the patient later this morning. We'll obtain physical therapy evaluation. Provide her with some pain medications as needed.    Heide Brossart A [phone] 11/12/2011, 3:45 AM

## 2011-11-12 NOTE — Consult Note (Signed)
Reason for Consult: Ischial fracture on the right Referring Physician: Analeah Brame is an 78 y.o. female.  HPI: Patient is 78 year old woman who is aler,t fell sustaining a ischial fracture on the right. Patient states that she does use her walker.  Past Medical History  Diagnosis Date  . Hypothyroidism   . CVA (cerebral infarction)     Past Surgical History  Procedure Date  . Fracture surgery     pelvis  . Thyroidectomy     Family History  Problem Relation Age of Onset  . Other Mother     pneumonia  . Colon cancer Neg Hx     Social History:  reports that she has never smoked. She has never used smokeless tobacco. She reports that she does not drink alcohol or use illicit drugs.  Allergies:  Allergies  Allergen Reactions  . Amoxicillin Rash    Medications: I have reviewed the patient's current medications.  Results for orders placed during the hospital encounter of 11/11/11 (from the past 48 hour(s))  POCT I-STAT, CHEM 8     Status: Abnormal   Collection Time   11/12/11  3:09 AM      Component Value Range Comment   Sodium 142  135 - 145 (mEq/L)    Potassium 4.3  3.5 - 5.1 (mEq/L)    Chloride 106  96 - 112 (mEq/L)    BUN 25 (*) 6 - 23 (mg/dL)    Creatinine, Ser 5.40  0.50 - 1.10 (mg/dL)    Glucose, Bld 981 (*) 70 - 99 (mg/dL)    Calcium, Ion 1.91  1.12 - 1.32 (mmol/L)    TCO2 25  0 - 100 (mmol/L)    Hemoglobin 13.9  12.0 - 15.0 (g/dL)    HCT 47.8  29.5 - 62.1 (%)     Dg Hip Complete Right  11/11/2011  *RADIOLOGY REPORT*  Clinical Data: Fall with right hip pain.  RIGHT HIP - COMPLETE 2+ VIEW  Comparison: None.  Findings: Moderate osteopenia.  Prior left proximal femoral fixation.  Both femoral heads are located.  There has been vertebral augmentation within the lower lumbar spine.  Lower lumbar spondylosis.  Dedicated views of the left hip demonstrate no convincing evidence of femur fracture.  Vascular calcifications.  IMPRESSION: No acute osseous  abnormality.  Given the severity of osteopenia, if there is a strong clinical concern of fracture, consider further evaluation with MRI.  Original Report Authenticated By: Consuello Bossier, M.D.   Mr Hip Right Wo Contrast  11/12/2011  **ADDENDUM** CREATED: 11/12/2011 03:08:34  The body of the report inadvertently references the left ischial tuberosity.  The abnormality is the right ischial tuberosity.  **END ADDENDUM** SIGNED BY: Waneta Martins, M.D.   11/12/2011  *RADIOLOGY REPORT*  Clinical Data: Right hip pain status post fall  MRI OF THE RIGHT HIP WITHOUT CONTRAST  Technique:  Multiplanar, multisequence MR imaging was performed. No intravenous contrast was administered.  Comparison: 11/11/2011 radiograph  Findings: No right femoral neck fracture identified.  The femoral head is seated within the acetabulum.  Left hip arthroplasty is noted.  There is a significant amount of edema in the musculature of the posterior thigh. There is marrow edema involving the left ischial tuberosity where there is an avulsion fracture at the hamstring origin.  Associated tendon thickening / edema.  Colonic diverticulosis.  Mild presacral edema is nonspecific.  The sacrum is incompletely imaged.  IMPRESSION: Findings are most in keeping with a right proximal hamstring  injury / osseous avulsion.  Discussed via telephone with Dr. Rosalia Hammers at 02:00 a.m. on 11/12/2011. Original Report Authenticated By: Waneta Martins, M.D.    Review of Systems  All other systems reviewed and are negative.   Blood pressure 128/71, pulse 86, temperature 99 F (37.2 C), temperature source Oral, resp. rate 16, height 5\' 4"  (1.626 m), weight 48.626 kg (107 lb 3.2 oz), SpO2 95.00%. Physical Exam On examination there is no pain with range of motion of her hip knee or ankle. Review of the MRI scan shows a avulsion fracture off the ischial tuberosity on the right. Assessment/Plan: Assessment: Avulsion fracture right ischial tuberosity.  Plan:  Patient may begin mobilizing weightbearing as tolerated, no restrictions. I'll followup as needed, no outpatient orthopedic followup necessary.  Mckaela Howley V 11/12/2011, 6:23 AM

## 2011-11-12 NOTE — Progress Notes (Signed)
Physical Therapy Evaluation Note  Past Medical History  Diagnosis Date  . Hypothyroidism   . CVA (cerebral infarction)     Past Surgical History  Procedure Date  . Fracture surgery     pelvis  . Thyroidectomy      11/12/11 1047  PT Visit Information  Last PT Received On 11/12/11  Assistance Needed +1  PT Time Calculation  PT Start Time 1047  PT Stop Time 1105  PT Time Calculation (min) 18 min  Subjective Data  Subjective Pt received supine in bed with report of minimal R hip pain.  Precautions  Precautions Fall  Restrictions  Weight Bearing Restrictions Yes  RLE Weight Bearing WBAT  Home Living  Lives With (24 hour caregivers)  Available Help at Discharge Available 24 hours/day;Personal care attendant  Type of Home House  Home Access Ramped entrance  Home Layout One level  Bathroom Shower/Tub (sponge bathing at sink or commode)  Firefighter Handicapped height  Bathroom Accessibility Yes  How Accessible Accessible via walker  Home Adaptive Equipment Bedside commode/3-in-1;Walker - rolling  Prior Function  Level of Independence Needs assistance  Needs Assistance Bathing;Dressing;Feeding;Meal Prep;Light Housekeeping;Gait;Transfers  Bath Moderate  Dressing Moderate  Feeding Supervision/set-up  Meal Prep Total  Light Housekeeping Total  Driving No  Vocation Retired  Advertising account planner  Overall Cognitive Status Appears within functional limits for tasks assessed/performed  Arousal/Alertness Awake/alert  Orientation Level Oriented X4 / Intact  Behavior During Session [facility] for tasks performed  Right Upper Extremity Assessment  RUE ROM/Strength/Tone WFL  Left Upper Extremity Assessment  LUE ROM/Strength/Tone WFL  Right Lower Extremity Assessment  RLE ROM/Strength/Tone WFL  Left Lower Extremity Assessment  LLE ROM/Strength/Tone WFL  Trunk Assessment  Trunk Assessment Kyphotic  Bed Mobility  Bed Mobility Supine to Sit;Sitting -  Scoot to Edge of Bed  Supine to Sit 4: Min assist;HOB elevated  Sitting - Scoot to [place] of Bed 3: Mod assist  Details for Bed Mobility Assistance pt initiated transfer to EOB but required assist to scoot to EOB  Transfers  Transfers Sit to Stand;Stand to Sit  Sit to Stand 4: Min assist;With upper extremity assist;From bed  Stand to Sit 4: Min guard;With upper extremity assist;To chair/3-in-1  Details for Transfer Assistance pt with good technique with hand placement on walker  Ambulation/Gait  Ambulation/Gait Assistance 4: Min assist  Ambulation Distance (Feet) 15 Feet  Assistive device Rolling walker  Ambulation/Gait Assistance Details limited by 10/10 R hip pain with R LE weight-bearing  Gait Pattern Step-to pattern;Decreased step length - right;Decreased stance time - right;Antalgic;Decreased stride length  Gait velocity slow  General Gait Details pt with good/safe use of walker  Stairs No  PT - End of Session  Equipment Utilized During Treatment Gait belt  Activity Tolerance Patient limited by pain  Patient left in chair;with call bell/phone within reach  Nurse Communication Mobility status  PT Assessment  Clinical Impression Statement Pt with R ischial fx however per Dr. Lajoyce Corners, they are managing it non-operatively and she can proceed with OOB mobility with R LE WBAT. Patient safe to return home with 24/7 hired assist as PTA and HHPT. Patient mobility limited by R LE pain however has 24/7 assist and appropriate home set up and DME.  PT Recommendation/Assessment Patient needs continued PT services  PT Problem List Decreased strength;Decreased range of motion;Decreased activity tolerance;Decreased balance;Decreased mobility  Barriers to Discharge None  PT Therapy Diagnosis  Difficulty walking;Abnormality of gait;Generalized weakness;Acute pain  PT  Plan  PT Frequency Min 5X/week  PT Treatment/Interventions Gait training;Functional mobility training;Therapeutic activities;Therapeutic  exercise  PT Recommendation  Follow Up Recommendations Home health PT;Supervision/Assistance - 24 hour  Equipment Recommended None recommended by PT  Individuals Consulted  Consulted and Agree with Results and Recommendations Patient  Acute Rehab PT Goals  PT Goal Formulation With patient  Time For Goal Achievement 11/19/11  Potential to Achieve Goals Good  Pt will go Supine/Side to Sit with supervision  PT Goal: Supine/Side to Sit - Progress Goal set today  Pt will go Sit to Supine/Side with supervision  PT Goal: Sit to Supine/Side - Progress Goal set today  Pt will go Sit to Stand with supervision (up to RW.)  PT Goal: Sit to Stand - Progress Goal set today  Pt will Ambulate 51 - 150 feet;with supervision;with rolling walker  PT Goal: Ambulate - Progress Goal set today  PT General Charges  $$ ACUTE PT VISIT 1 Procedure  PT Evaluation  $Initial PT Evaluation Tier II 1 Procedure  Written Expression  Dominant Hand Right    Pain: initially 0/10 R hip pain in bed, 10/10 R hip pain with R LE WBing/ambulation  Lewis Shock, PT, DPT Pager #: [phone] Office #: [phone]

## 2011-11-12 NOTE — ED Notes (Signed)
Patient transported to MRI 

## 2011-11-12 NOTE — ED Notes (Signed)
MD at bedside. 

## 2011-11-13 DIAGNOSIS — E119 Type 2 diabetes mellitus without complications: Secondary | ICD-10-CM

## 2011-11-13 DIAGNOSIS — M25559 Pain in unspecified hip: Secondary | ICD-10-CM

## 2011-11-13 DIAGNOSIS — S329XXA Fracture of unspecified parts of lumbosacral spine and pelvis, initial encounter for closed fracture: Secondary | ICD-10-CM

## 2011-11-13 DIAGNOSIS — E032 Hypothyroidism due to medicaments and other exogenous substances: Secondary | ICD-10-CM

## 2011-11-13 MED ORDER — TRAMADOL HCL 50 MG PO TABS
50.0000 mg | ORAL_TABLET | Freq: Three times a day (TID) | ORAL | Status: AC | PRN
Start: 2011-11-13 — End: 2011-11-23

## 2011-11-13 MED ORDER — ACETAMINOPHEN 325 MG PO TABS: 650.0000 mg | ORAL_TABLET | Freq: Four times a day (QID) | ORAL | Status: AC | PRN

## 2011-11-13 NOTE — Progress Notes (Signed)
Gave patient and HPA (rachel) home discharge instructions. Patient and HPA expressed understanding of these instructions.

## 2011-11-13 NOTE — Discharge Instructions (Signed)
Home Health RN/Physical therapy, Occupational therapy, Nurse's Aide, Social Worker to be provided by [employer] [phone]

## 2011-11-13 NOTE — Progress Notes (Signed)
CARE MANAGEMENT NOTE 11/13/2011  Patient:  Simenson,Analei   Account Number:  [id number]  Date Initiated:  11/12/2011  Documentation initiated by:  Vance Peper    Anticipated DC Date:  11/13/2011   Anticipated DC Plan:  HOME W HOME HEALTH SERVICES      DC Planning Services  CM consult      [facility] Choice  HOME HEALTH   Choice offered to / List presented to:  C-2 HC POA / Guardian        HH arranged  HH-1 RN  HH-2 PT  HH-3 OT  HH-4 NURSE'S AIDE  HH-6 SOCIAL WORKER      HH agency  [facility]   Status of service:  Completed, signed off  Discharge Disposition:  HOME W HOME HEALTH SERVICES  ed:    Comments:  11/13/11 9:13 Vance Peper, RN BSN Case Manager CM spoke with patient's POA -Fleet Contras [phone]. Explained that she will need to apply for medicaid on patient's behalf. Physical therapist states patient is doing much better, needs [facility] PT/OT.

## 2011-11-13 NOTE — Progress Notes (Signed)
Physical Therapy Treatment Patient Details Name: Glenda Rogers MRN: [id number] DOB: 07-23-1907 Today's Date: 11/13/2011 Time: 4098-1191 PT Time Calculation (min): 28 min  PT Assessment / Plan / Recommendation Comments on Treatment Session  Pt making good progress with mobility + PT goals.  Increased time due to pt with bladder incontinence upon standing & requiring sock/gown change + pericare.       Follow Up Recommendations  Home health PT;Supervision/Assistance - 24 hour    Barriers to Discharge        Equipment Recommendations  None recommended by PT    Recommendations for Other Services    Frequency Min 5X/week   Plan Discharge plan remains appropriate    Precautions / Restrictions Precautions Precautions: Fall Restrictions Weight Bearing Restrictions: Yes RLE Weight Bearing: Weight bearing as tolerated   Pertinent Vitals/Pain C/o pain in RLE during OOB activity.  Did not rate.  Pt refusing pain meds per RN.      Mobility  Bed Mobility Bed Mobility: Supine to Sit;Sitting - Scoot to Edge of Bed Supine to Sit: 4: Min guard;HOB flat Sitting - Scoot to [place] of Bed: 4: Min guard Details for Bed Mobility Assistance: No physical (A) needed.  Cues for technique to increase ease Transfers Transfers: Sit to Stand;Stand to Sit Sit to Stand: 4: Min assist;With upper extremity assist;From bed Stand to Sit: 4: Min guard;With upper extremity assist;To bed;To chair/3-in-1 Details for Transfer Assistance: (A) for balance & anterior translation of trunk over BOS.  Continues to demonstrate good technique Ambulation/Gait Ambulation/Gait Assistance: 4: Min guard Ambulation Distance (Feet): 40 Feet Assistive device: Rolling walker Ambulation/Gait Assistance Details: Cues for tall posture, sequencing, & to stay inside RW.  Reports pain with RLE weight bearing but did not rate.   Gait Pattern: Step-to pattern;Antalgic;Decreased stance time - right Stairs: No Wheelchair  Mobility Wheelchair Mobility: No    Exercises     PT Diagnosis:    PT Problem List:   PT Treatment Interventions:     PT Goals Acute Rehab PT Goals Time For Goal Achievement: 11/19/11 Potential to Achieve Goals: Good PT Goal: Supine/Side to Sit - Progress: Progressing toward goal PT Goal: Sit to Stand - Progress: Progressing toward goal PT Goal: Ambulate - Progress: Progressing toward goal  Visit Information  Last PT Received On: 11/13/11 Assistance Needed: +1    Subjective Data      Cognition  Overall Cognitive Status: Appears within functional limits for tasks assessed/performed Arousal/Alertness: Awake/alert Orientation Level: Oriented X4 / Intact Behavior During Session: [facility] for tasks performed    Balance  Balance Balance Assessed: No  End of Session PT - End of Session Equipment Utilized During Treatment: Gait belt Activity Tolerance: Patient tolerated treatment well;Patient limited by fatigue Patient left: in chair;with call bell/phone within reach    Lara Mulch 11/13/2011, 9:11 AM  Verdell Face, PTA [phone] 11/13/2011

## 2011-11-21 NOTE — Discharge Summary (Signed)
Physician Discharge Summary  Patient ID: Glenda Rogers MRN: [id number] DOB/AGE: 1907/12/12 78 y.o.  Admit date: 11/11/2011 Discharge date: 11/21/2011  Primary Care Physician:  Georgianne Fick, MD   Discharge Diagnoses:    *Avulsion fracture of pelvis  Fall at home  Hypothyroidism Osteoporosis  Medication List  As of 11/21/2011 10:22 PM   TAKE these medications         acetaminophen 325 MG tablet   Commonly known as: TYLENOL   Take 2 tablets (650 mg total) by mouth every 6 (six) hours as needed for pain (or Fever >/= 101).      aspirin 325 MG tablet   Take 325 mg by mouth daily.      CALCIUM 600 + D PO   Take 1 tablet by mouth daily.      SYNTHROID 50 MCG tablet   Generic drug: levothyroxine   Take 1 tablet by mouth Daily.      traMADol 50 MG tablet   Commonly known as: ULTRAM   Take 1 tablet (50 mg total) by mouth every 8 (eight) hours as needed for pain (for moderate to severe pain).      TRAVATAN Z 0.004 % Soln ophthalmic solution   Generic drug: Travoprost (BAK Free)   Place 2 drops into both eyes Daily.           Disposition and Follow-up:  PCP in 1 week Dr.Duda orthopedics if needed only  Consults:  Dr.Duda, Orthopedics  Significant Diagnostic Studies:  MRI OF THE RIGHT HIP WITHOUT CONTRAST IMPRESSION: Findings are most in keeping with a right proximal hamstring injury / osseous avulsion. The body of the report inadvertently references the left ischial tuberosity. The abnormality is the right ischial tuberosity  Brief H and P: Very healthy 78 year old female with only a history of hypothyroidism who and walks with a walker is doing something today without her walker and knocked off some things off of the table. When she gets to bend down and pick it up she lost her balance and fell and hurt her hip. She went to [facility]. Initial x-ray was negative. So she was transferred to [facility], for MRI tonight. MRI shows an avulsion fracture with hip fracture.  We are being asked to admit the patient because she is having uncontrolled pain with movement. She does have 24-hour sitter at home and is highly functioning.  Hospital Course:  1.Avulsion fracture of pelvis/R Ischial tuberosity CLinically improved with tylenol and tramadol PRN Was seen by Dr.Duda and PT in consultation Weight bearing as tolerated was recommended Being discharged home with home health PT services Advised to follow up with PCP to start treatment for osteoporosis   Time spent on Discharge:  Signed: Heberto [facility]  11/21/2011, 10:22 PM

## 2012-03-19 IMAGING — CR DG NECK SOFT TISSUE
1 series · 1 of 1 positions shown · non-contrast
Comparison: None.

CLINICAL DATA: Cough, sore throat and choking.

NECK SOFT TISSUES - 1+ VIEW

[w soft tissue neck *]
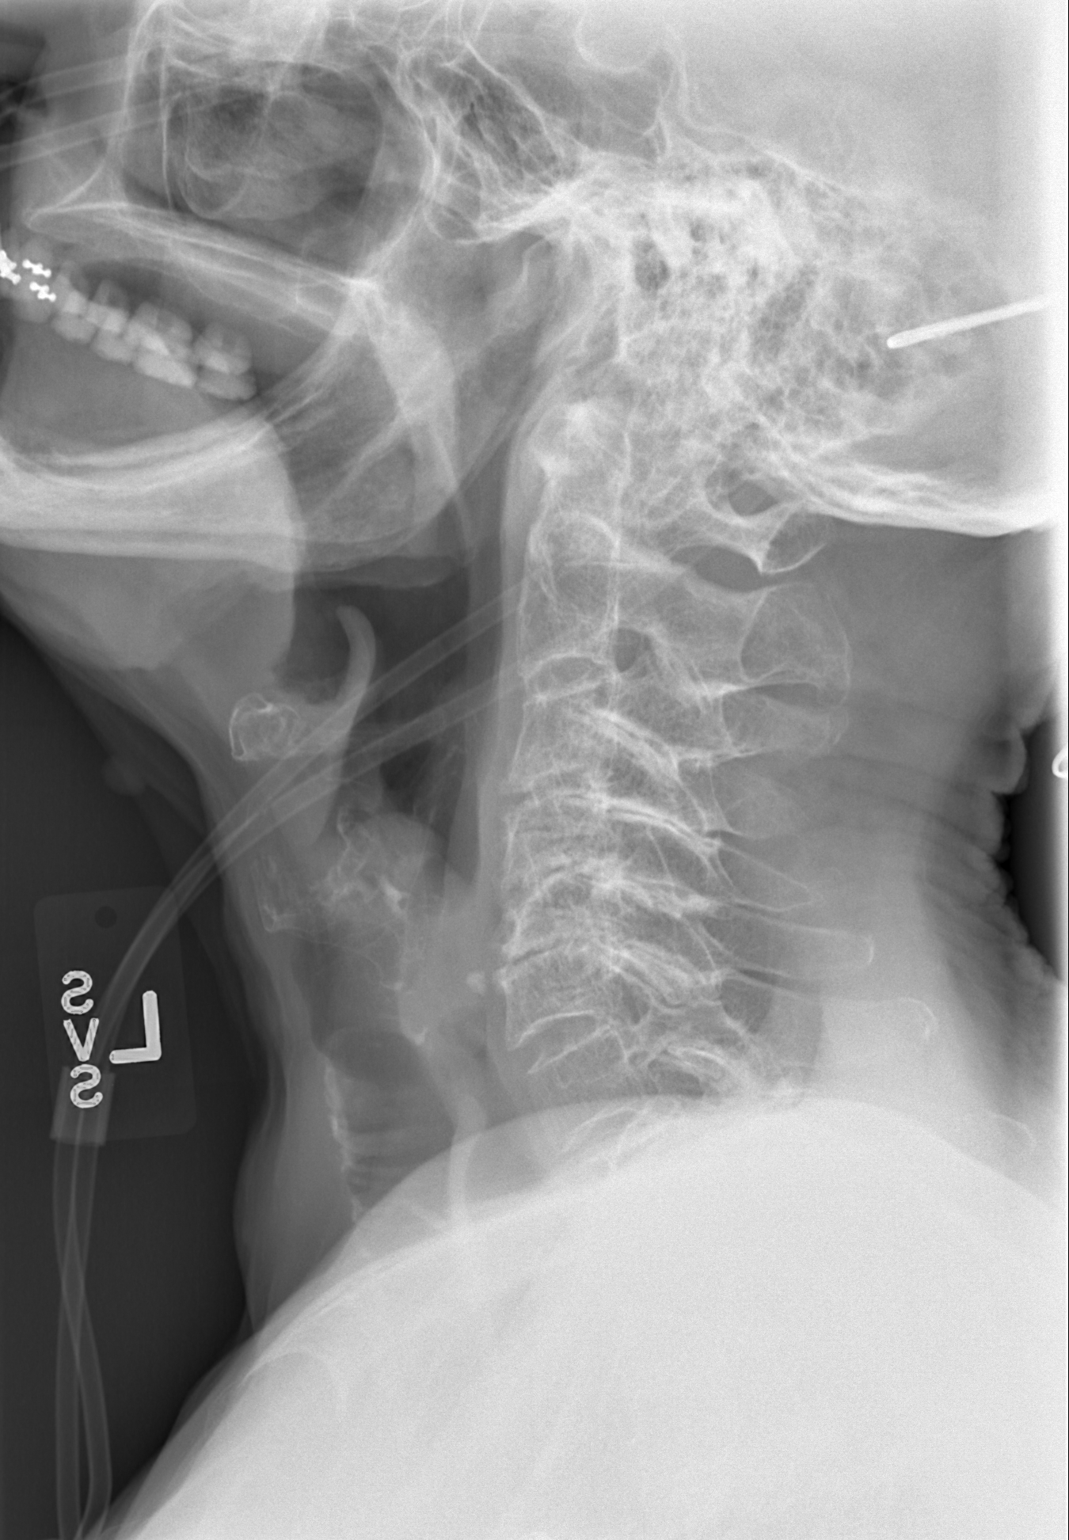

[1 of 1 positions shown; findings below may reference images not displayed]

FINDINGS: Soft tissues of the neck appear unremarkable by x-ray.
No evidence of soft tissue swelling or foreign body.  Visualized
bony structures show diffuse osteopenia and advanced degenerative
spondylosis of the cervical spine.
IMPRESSION: Unremarkable soft tissues of the neck.

## 2012-09-20 ENCOUNTER — Ambulatory Visit (INDEPENDENT_AMBULATORY_CARE_PROVIDER_SITE_OTHER): Payer: Medicare Other | Admitting: Family Medicine

## 2012-09-20 DIAGNOSIS — L089 Local infection of the skin and subcutaneous tissue, unspecified: Secondary | ICD-10-CM

## 2012-09-20 DIAGNOSIS — M79609 Pain in unspecified limb: Secondary | ICD-10-CM

## 2012-09-20 DIAGNOSIS — IMO0002 Reserved for concepts with insufficient information to code with codable children: Secondary | ICD-10-CM

## 2012-09-20 MED ORDER — CEPHALEXIN 250 MG PO CAPS: 250.0000 mg | ORAL_CAPSULE | Freq: Three times a day (TID) | ORAL | Status: DC

## 2012-09-20 NOTE — Progress Notes (Signed)
[address]   [place], [place]  [address]   [phone]  Subjective:    Patient ID: Glenda Rogers, female    DOB: Nov 30, 1907, 79 y.o.   MRN: [id number]  HPI This 79 y.o. female presents for evaluation of RLE wound.  Toilet paper rack fell on leg within the past week. Has a superficial wound along anterior shin that is not healing well.  Development of redness around wound yesterday and is concerning family members.  No fever/chills/sweats; no malaise or fatigue.  +swelling localized around wound. No active drainage or odor.  Cleansed with peroxide.  Last Tetanus less than five years ago per patient.     Review of Systems  Constitutional: Negative for fever, chills, diaphoresis and fatigue.  Skin: Positive for color change and wound. Negative for pallor and rash.    Past Medical History  Diagnosis Date  . Hypothyroidism   . CVA (cerebral infarction)   . Arthritis   . Allergy     seasonal    Past Surgical History  Procedure Laterality Date  . Fracture surgery      pelvis  . Thyroidectomy      Prior to Admission medications   Medication Sig Start Date End Date Taking? Authorizing Provider  acetaminophen (TYLENOL) 325 MG tablet Take 2 tablets (650 mg total) by mouth every 6 (six) hours as needed for pain (or Fever >/= 101). 11/13/11 11/12/12 Yes Zannie Cove, MD  aspirin 325 MG tablet Take 325 mg by mouth daily.     Yes Historical Provider, MD  Calcium Carbonate-Vitamin D (CALCIUM 600 + D PO) Take 1 tablet by mouth daily.    Yes Historical Provider, MD  SYNTHROID 50 MCG tablet Take 1 tablet by mouth Daily. 09/01/10  Yes Historical Provider, MD  TRAVATAN Z 0.004 % ophthalmic solution Place 2 drops into both eyes Daily. 12/21/10  Yes Historical Provider, MD  cephALEXin (KEFLEX) 250 MG capsule Take 1 capsule (250 mg total) by mouth 3 (three) times daily. 09/20/12   Ethelda Chick, MD    Allergies  Allergen Reactions  . Amoxicillin Rash    History   Social History  .  Marital Status: Widowed    Spouse Name: N/A    Number of Children: 0  . Years of Education: N/A   Occupational History  . Retired     Runner, broadcasting/film/video    Social History Main Topics  . Smoking status: Never Smoker   . Smokeless tobacco: Never Used  . Alcohol Use: No  . Drug Use: No  . Sexually Active: Not on file   Other Topics Concern  . Not on file   Social History Narrative  . No narrative on file    Family History  Problem Relation Age of Onset  . Other Mother     pneumonia  . Colon cancer Neg Hx        Objective:   Physical Exam  Constitutional: She appears well-developed and well-nourished. No distress.  Cardiovascular:  Decreased DP pulse R foot; capillary refill approximately 3 seconds.  Skin: She is not diaphoretic.  R distal shin with 3 cm x 2 cm superficial abrasion with eschar; 1 cm surrounding erythema with mild induration; no associated fluctuants; mildly TTP.  No active drainage.  No streaking.    Psychiatric: She has a normal mood and affect. Her behavior is normal.   Wound cleansed with sterile saline in office.    Assessment & Plan:  Pain, lower leg, right  Abrasion of leg, right, infected, initial encounter    1. Pain RLE: New. Secondary to RLE abrasion with secondary infection. 2.  Abrasion of RLE infected initial encounter: New.  Tetanus UTD by report.  Rx for Keflex provided; pt and family deny an allergy to PCN. Local wound care reviewed in detail; cleanse daily with soap and water; apply Neosporin to wound every morning and cover.  At night, cleanse wound with peroxide and leave bandage off at night.  RTC immediately for fever, drainage, increasing redness, increasing pain.  Follow-up with PCP in upcoming 5-7 days.  Meds ordered this encounter  Medications  . cephALEXin (KEFLEX) 250 MG capsule    Sig: Take 1 capsule (250 mg total) by mouth 3 (three) times daily.    Dispense:  30 capsule    Refill:  0

## 2012-09-20 NOTE — Patient Instructions (Addendum)
1.  CLEAN WOUND WITH SOAP AND WATER DAILY. 2. APPLY NEOSPORIN TO WOUND EVERY MORNING AND APPLY DRESSING EVERY MORNING. 3. REMOVE BANDAGE EVERY EVENING . 4.  FOLLOW-UP WITH PRIMARY CARE DOCTOR IN 7-10 DAYS IF NOT IMPROVED.

## 2012-11-16 ENCOUNTER — Ambulatory Visit (INDEPENDENT_AMBULATORY_CARE_PROVIDER_SITE_OTHER): Payer: Medicare Other | Admitting: Family Medicine

## 2012-11-16 VITALS — BP 137/73 | HR 69 | Temp 98.2°F | Resp 16

## 2012-11-16 DIAGNOSIS — M79609 Pain in unspecified limb: Secondary | ICD-10-CM

## 2012-11-16 DIAGNOSIS — S8010XA Contusion of unspecified lower leg, initial encounter: Secondary | ICD-10-CM

## 2012-11-16 DIAGNOSIS — S8012XA Contusion of left lower leg, initial encounter: Secondary | ICD-10-CM

## 2012-11-16 MED ORDER — SILVER SULFADIAZINE 1 % EX CREA: TOPICAL_CREAM | Freq: Every day | CUTANEOUS | Status: DC

## 2012-11-16 NOTE — Progress Notes (Signed)
79 year old woman who bumped her left anterior mid calf on the progress of her wheelchair this morning at church. She developed a sizable blood blister which has been enlarging since.  Objective: No acute distress There is a large area of ecchymosis extending from just couple inches below the knee to a couple inches above the ankle on the left lateral lower extremity. In the center of this is a 2 x 2 centimeter blood-filled bulla.  After careful bleeding prep, the bulla was incised with a half centimeter incision and clot was expressed and lysed blood was also drained.  Assessment: Contusion lower left leg without involving deeper structures, blood bulla drained  Plan: Silvadene dressing for week  Signed, Sheila Oats.D.

## 2013-01-28 ENCOUNTER — Ambulatory Visit (INDEPENDENT_AMBULATORY_CARE_PROVIDER_SITE_OTHER): Payer: Medicare Other | Admitting: Family Medicine

## 2013-01-28 VITALS — BP 96/60 | HR 80 | Temp 97.7°F | Resp 17 | Wt 117.0 lb

## 2013-01-28 DIAGNOSIS — M79609 Pain in unspecified limb: Secondary | ICD-10-CM

## 2013-01-28 DIAGNOSIS — L03116 Cellulitis of left lower limb: Secondary | ICD-10-CM

## 2013-01-28 DIAGNOSIS — L02419 Cutaneous abscess of limb, unspecified: Secondary | ICD-10-CM

## 2013-01-28 DIAGNOSIS — S8012XA Contusion of left lower leg, initial encounter: Secondary | ICD-10-CM

## 2013-01-28 MED ORDER — CEPHALEXIN 250 MG PO CAPS: 250.0000 mg | ORAL_CAPSULE | Freq: Three times a day (TID) | ORAL | Status: DC

## 2013-01-28 MED ORDER — SILVER SULFADIAZINE 1 % EX CREA: TOPICAL_CREAM | Freq: Every day | CUTANEOUS | Status: DC

## 2013-01-28 NOTE — Progress Notes (Signed)
This is a 79 year old woman who comes in with a recurrence of left leg cellulitis. She had the same problem back in June and did well. Nevertheless the last several days she's developed another blood blister over the left lateral anterior shin. She's not complaining about pain in his been no fever.  Objective: No acute distress, patient sitting in wheelchair. Left shin shows diffuse erythema over 25% the lateral aspect. There is a central 3 cm blood blister  Area was I&D.  Assessment: Recurrent cellulitis left shin  Plan:

## 2013-05-08 ENCOUNTER — Inpatient Hospital Stay (HOSPITAL_COMMUNITY)
Admission: EM | Admit: 2013-05-08 | Discharge: 2013-05-11 | DRG: 379 | Disposition: A | Discharge status: Home Health | Payer: Medicare Other | Attending: Internal Medicine | Admitting: Internal Medicine

## 2013-05-08 ENCOUNTER — Encounter (HOSPITAL_COMMUNITY): Payer: Self-pay | Admitting: Emergency Medicine

## 2013-05-08 DIAGNOSIS — Z86010 Personal history of colonic polyps: Secondary | ICD-10-CM

## 2013-05-08 DIAGNOSIS — Z8673 Personal history of transient ischemic attack (TIA), and cerebral infarction without residual deficits: Secondary | ICD-10-CM

## 2013-05-08 DIAGNOSIS — J90 Pleural effusion, not elsewhere classified: Secondary | ICD-10-CM

## 2013-05-08 DIAGNOSIS — K5731 Diverticulosis of large intestine without perforation or abscess with bleeding: Principal | ICD-10-CM | POA: Diagnosis present

## 2013-05-08 DIAGNOSIS — E039 Hypothyroidism, unspecified: Secondary | ICD-10-CM

## 2013-05-08 DIAGNOSIS — K922 Gastrointestinal hemorrhage, unspecified: Secondary | ICD-10-CM

## 2013-05-08 DIAGNOSIS — K625 Hemorrhage of anus and rectum: Secondary | ICD-10-CM

## 2013-05-08 DIAGNOSIS — M129 Arthropathy, unspecified: Secondary | ICD-10-CM | POA: Diagnosis present

## 2013-05-08 DIAGNOSIS — D692 Other nonthrombocytopenic purpura: Secondary | ICD-10-CM | POA: Diagnosis present

## 2013-05-08 DIAGNOSIS — I639 Cerebral infarction, unspecified: Secondary | ICD-10-CM | POA: Diagnosis present

## 2013-05-08 DIAGNOSIS — Z7902 Long term (current) use of antithrombotics/antiplatelets: Secondary | ICD-10-CM

## 2013-05-08 DIAGNOSIS — Z66 Do not resuscitate: Secondary | ICD-10-CM | POA: Diagnosis present

## 2013-05-08 DIAGNOSIS — R066 Hiccough: Secondary | ICD-10-CM

## 2013-05-08 HISTORY — DX: Other seasonal allergic rhinitis: J30.2

## 2013-05-08 HISTORY — DX: Dysphagia, unspecified: R13.10

## 2013-05-08 HISTORY — DX: Unspecified glaucoma: H40.9

## 2013-05-08 HISTORY — DX: Personal history of colonic polyps: Z86.010

## 2013-05-08 HISTORY — DX: Other specified postprocedural states: Z98.890

## 2013-05-08 LAB — CBC
HCT: 37.1 % (ref 36.0–46.0)
MCH: 31.9 pg (ref 26.0–34.0)
MCH: 32 pg (ref 26.0–34.0)
MCHC: 33.4 g/dL (ref 30.0–36.0)
MCV: 95.4 fL (ref 78.0–100.0)
Platelets: 141 10*3/uL — ABNORMAL LOW (ref 150–400)
RBC: 4.32 MIL/uL (ref 3.87–5.11)
RDW: 14.2 % (ref 11.5–15.5)
RDW: 14.2 % (ref 11.5–15.5)

## 2013-05-08 LAB — BASIC METABOLIC PANEL
CO2: 29 mEq/L (ref 19–32)
Calcium: 9 mg/dL (ref 8.4–10.5)
Creatinine, Ser: 0.96 mg/dL (ref 0.50–1.10)

## 2013-05-08 LAB — TYPE AND SCREEN

## 2013-05-08 LAB — PROTIME-INR: Prothrombin Time: 13.7 seconds (ref 11.6–15.2)

## 2013-05-08 LAB — ABO/RH: ABO/RH(D): A POS

## 2013-05-08 LAB — MRSA PCR SCREENING: MRSA by PCR: NEGATIVE

## 2013-05-08 MED ORDER — TOBRAMYCIN 0.3 % OP SOLN
1.0000 [drp] | Freq: Every day | OPHTHALMIC | Status: DC
  Administered 2013-05-08 – 2013-05-10 (×3): 1 [drp] via OPHTHALMIC
  Filled 2013-05-08: qty 5

## 2013-05-08 MED ORDER — LEVOTHYROXINE SODIUM 50 MCG PO TABS
50.0000 ug | ORAL_TABLET | Freq: Every day | ORAL | Status: DC
  Administered 2013-05-09 – 2013-05-11 (×3): 50 ug via ORAL
  Filled 2013-05-08 (×4): qty 1

## 2013-05-08 MED ORDER — LATANOPROST 0.005 % OP SOLN
1.0000 [drp] | Freq: Every day | OPHTHALMIC | Status: DC
  Administered 2013-05-08 – 2013-05-10 (×3): 1 [drp] via OPHTHALMIC
  Filled 2013-05-08: qty 2.5

## 2013-05-08 MED ORDER — ONDANSETRON HCL 4 MG PO TABS: 4.0000 mg | ORAL_TABLET | Freq: Four times a day (QID) | ORAL | Status: DC | PRN

## 2013-05-08 MED ORDER — ACETAMINOPHEN 325 MG PO TABS: 650.0000 mg | ORAL_TABLET | Freq: Four times a day (QID) | ORAL | Status: DC | PRN

## 2013-05-08 MED ORDER — ONDANSETRON HCL 4 MG/2ML IJ SOLN: 4.0000 mg | Freq: Four times a day (QID) | INTRAMUSCULAR | Status: DC | PRN

## 2013-05-08 MED ORDER — SODIUM CHLORIDE 0.9 % IJ SOLN
3.0000 mL | Freq: Two times a day (BID) | INTRAMUSCULAR | Status: DC
  Administered 2013-05-09 – 2013-05-11 (×4): 3 mL via INTRAVENOUS

## 2013-05-08 MED ORDER — BRIMONIDINE TARTRATE 0.2 % OP SOLN
1.0000 [drp] | Freq: Two times a day (BID) | OPHTHALMIC | Status: DC
  Administered 2013-05-08 – 2013-05-11 (×6): 1 [drp] via OPHTHALMIC
  Filled 2013-05-08: qty 5

## 2013-05-08 MED ORDER — ACETAMINOPHEN 650 MG RE SUPP: 650.0000 mg | Freq: Four times a day (QID) | RECTAL | Status: DC | PRN

## 2013-05-08 MED ORDER — DEXTROSE-NACL 5-0.9 % IV SOLN
INTRAVENOUS | Status: DC
  Administered 2013-05-08: 17:00:00 via INTRAVENOUS
  Administered 2013-05-09: 1000 mL via INTRAVENOUS

## 2013-05-08 MED ORDER — BRIMONIDINE TARTRATE 0.15 % OP SOLN: 1.0000 [drp] | Freq: Three times a day (TID) | OPHTHALMIC | Status: DC

## 2013-05-08 NOTE — Consult Note (Signed)
[facility] Consult: 1:13 PM 05/08/2013  LOS: 0 days    Referring Provider: ER PA-C, Arthor Captain.  Primary Care Physician:  Georgianne Fick, MD Primary Gastroenterologist:  Dr. Melvia Heaps    Reason for Consultation:  GI bleed   HPI: Glenda Rogers is a 79 y.o. female.  GI hax of esophageal dyphagia due to diminished peristalsis, MBSS in 2012.  Hx adenomatous colon polyp without HGD as well as diverticulosis in 2001, previous colon polyp in mid 1990s.  No EGD records and 2001 was the last colonoscopy.   Plavix added to 81 mg ASA by a [place] cardiologist, there are no cardiologist's notes in EPIC.  Sitter recalls the MD said the "top of her heart wasn't working".  Neither pt, caregiver or pt's good and longtime friend are aware of pt ever having had a stroke and there are no CTs or MRIs of brain to confirm CVA listed on her PMHX  Brought to ED by home health aid/sitter.  About 7 AM today passed dark, red, loose stool into diaper and then into commode.  This recurred later.  She has dark red stools on rectal exam.  There is no abd pain, no nausea, no anorexia. Denies dysphagia or coughing with POs.   Initial Hgb is 13.8, INR is 1.07.  BUN (as well as creatinine) is normal.  Weight stable, no heartburn.  Daily brown stools.  No nose bleeds.   On course of Keflex starting11/24/14 for wound on left shin, which she hit on an object and the skin tore.  She has permanent large areas of purpura on shins and arms.      Past Medical History  Diagnosis Date  . Hypothyroidism   . CVA (cerebral infarction)   . Arthritis   . Seasonal allergies   . H/O colonoscopy with polypectomy 2001    adenomatous without HGD  . Dysphagia 2012    MBSS with diminished esophageal peristalsis and esophageal dysphagia.    Past  Surgical History  Procedure Laterality Date  . Fracture surgery      pelvis  . Thyroidectomy      hx goiter  . Kyphoplasty  2009    L3.  to address compression fracture    Prior to Admission medications   Medication Sig Start Date End Date Taking? Authorizing Provider  ALPHAGAN P 0.1 % SOLN Place 1 drop into the left eye 2 (two) times daily.  04/23/13  Yes Historical Provider, MD  aspirin EC 81 MG tablet Take 81 mg by mouth daily.   Yes Historical Provider, MD  b complex vitamins tablet Take 1 tablet by mouth daily.   Yes Historical Provider, MD  Calcium Carbonate-Vitamin D (CALCIUM 600 + D PO) Take 1 tablet by mouth daily.    Yes Historical Provider, MD  cephALEXin (KEFLEX) 500 MG capsule Take 500 mg by mouth 2 (two) times daily. For 10 days 04/27/13  Yes Historical Provider, MD  clopidogrel (PLAVIX) 75 MG tablet Take 75 mg by mouth daily with breakfast.   Yes Historical Provider, MD  Erline Levine  50 MCG tablet Take 1 tablet by mouth Daily. 09/01/10  Yes Historical Provider, MD  tobramycin (TOBREX) 0.3 % ophthalmic solution Place 1 drop into the left eye at bedtime.  04/23/13  Yes Historical Provider, MD  TRAVATAN Z 0.004 % ophthalmic solution Place 2 drops into both eyes Daily. 12/21/10  Yes Historical Provider, MD    Scheduled Meds:  Infusions:  PRN Meds:      Allergies as of 05/08/2013 - Review Complete 05/08/2013  Allergen Reaction Noted  . Amoxicillin Rash 11/09/2010    Family History  Problem Relation Age of Onset  . Other Mother     pneumonia  . Colon cancer Neg Hx     History   Social History  . Marital Status: Widowed    Spouse Name: N/A    Number of Children: 0  . Years of Education: N/A   Occupational History  . Retired     Runner, broadcasting/film/video    Social History Main Topics  . Smoking status: Never Smoker   . Smokeless tobacco: Never Used  . Alcohol Use: No  . Drug Use: No  . Sexual Activity: Not on file   Other Topics Concern  . Not on file   Social  History Narrative  . Lives at home with 24/7 caregivers    REVIEW OF SYSTEMS: Constitutional:  Ambulates with walker on her own.  Attends weekly Sunday church service ENT:  No nose bleeds Pulm:  No SOB, no cough CV:  No palpitations, no LE edema.  GU:  No hematuria, no frequency GI:  Per HPI.   Heme:  No hx of taking iron supplements Transfusions:  none Neuro:  No headaches, no peripheral tingling or numbness.  Very limited eye site.  Often has dry eyes.  HOH but no hearing aids. Derm:  No itching, no rash or sores.  Endocrine:  No sweats or chills.  No polyuria or dysuria Immunization:  Flu shot up to date.  Travel:  None beyond local counties in last few months.    PHYSICAL EXAM: Vital signs in last 24 hours: Filed Vitals:   05/08/13 1230  BP: 159/68  Pulse: 65  Temp:   Resp: 16   Wt Readings from Last 3 Encounters:  01/28/13 53.071 kg (117 lb)  09/20/12 59.421 kg (131 lb)  11/12/11 48.626 kg (107 lb 3.2 oz)   General: frail, aged WF who is comfortable and alert Head:  No asymmetry, no swellling, no trauma  Eyes:  Erythematous sclera and conjunctiva on right.  Cloudy cornea.  Eyes sensitive to overhead ligght. Ears:  HOH, no hearing aids in place  Nose:  No bleeding, no discharge Mouth:  Dry MM, no lesions.  Dentures in place, not removed Neck:  No mass or JVD Lungs:  clear Heart: RRR with 2/6 harsh systolic murmer.  Abdomen:  Soft, NT, ND, no mass.  Active BS.  No bruits.   Rectal: dark red stool in vault and on pad.  No masses.  Erythema around anus.  No decubitus ulcers GU:  Erythema in vaginal labia.   Musc/Skeltl: arthritic deformity in knees.  Kyphosis. Extremities:  No pedal edema.   Heme:  Large purpura and staining of heme in skin of both shins and forearms  Neurologic:  Oriented x 3.  HOH.  No tremor.  No limb weakness Skin:  Purpura as above.  Abrasion on left shin is bandaged Tattoos:  none Nodes:  No cervical or inguinal adenopathy   Psych:   Pleasant,  cooperative, relaxed, appropriate.   Intake/Output from previous day:   Intake/Output this shift:    LAB RESULTS:  Recent Labs  05/08/13 1022  WBC 5.8  HGB 13.8  HCT 41.2  PLT 141*   BMET Lab Results  Component Value Date   NA 140 05/08/2013   NA 142 11/12/2011   NA 140 04/28/2008   K 5.0 05/08/2013   K 4.3 11/12/2011   K 4.5 04/28/2008   CL 104 05/08/2013   CL 106 11/12/2011   CL 102 04/28/2008   CO2 29 05/08/2013   CO2 31 04/28/2008   GLUCOSE 97 05/08/2013   GLUCOSE 120* 11/12/2011   GLUCOSE 108* 04/28/2008   BUN 15 05/08/2013   BUN 25* 11/12/2011   BUN 12 04/28/2008   CREATININE 0.96 05/08/2013   CREATININE 1.10 11/12/2011   CREATININE 1.11 04/28/2008   CALCIUM 9.0 05/08/2013   CALCIUM 9.4 04/28/2008   LFT No results found for this basename: PROT, ALBUMIN, AST, ALT, ALKPHOS, BILITOT, BILIDIR, IBILI,  in the last 72 hours PT/INR Lab Results  Component Value Date   INR 1.07 05/08/2013   INR 1.0 04/28/2008   RADIOLOGY STUDIES: 11/2010  MBSS Primary esophageal dysphagia with diminished esophageal peristalsis.  OK'd for regular diet and thins.   ENDOSCOPIC STUDIES: 03/2000  Colonoscopy Indication: 4 to 5 cm colon polyp in the sigmoid that was  removed a year ago. This is a followup examination.  FINDINGS:  1. In the sigmoid at approximately 18 cm from the rectum, corresponding to the  previous polypectomy, there was about a 5 mm polyp remnant. This was  removed via snare cautery and submitted to pathology.  2. There were multiple diverticulum in the sigmoid colon.  3. In the descending colon, approximately 50 cm from the anus, was a sessile  5 to 6 mm polyp. This was removed via snare and hot biopsy forceps and  submitted to pathology.  4. The remainder of the exam was normal including transverse, ascending colon,  and cecum.  IMPRESSION:  1. Colon polyps, removed.  2. Diverticulosis.    IMPRESSION:   *  GI bleed.  Suspect diverticular No anemia  yet  *  Plavix therapy since 03/04/13 for ???.  No evidence of a CVA.   If we can find out why this was started we can better assess the long term need for this med.   [facility]MD[facility]ff[facility] is closed (the Plavix was RXd by him, I called [facility] to confirm this) .   PLAN:     *  Given her advanced age, best to hold Plavix and ASA and watch to see if bleeding resolves.  Conservatively follow her CBC (q 12 hours for now). *  Consider EGD just to assure that this is not an UGIB, but hold colonoscopy and it's tiresome prep for non-resolution of bleeding.  Also consider nuclear med RBC scan for destablizing GIB or significant drop in Hgb.   *  Allow clears *  Does a 79 y/o pt with DNR orders need tele monitoring?     Jennye Moccasin  05/08/2013, 1:13 PM Pager: [phone] Attending MD note:   I have taken a history, examined the patient, and reviewed the chart. I agree with the Advanced Practitioner's impression and recommendations. I have spoken to pt's caretaker. Suspect  moderate diverticular bleed, so far maintained Hgb13.8, but just had another bloody BM, so anticipate drop in next 12 hours. She is not a candidate for colonoscopy at her age  unless  in extreme circumstances. I suggest that after this complication ,Plavix  will not be restarted, but  ASA 81 mg may be considered if we find out the indication for  antiplatelet therapy.  Plan:  bowl rest for now, H/H as ordered, consider Nuclear Medicine RBC scan if actively bleeding.  [facility] Pager # [phone]

## 2013-05-08 NOTE — ED Notes (Signed)
Pt c/o eye pain and dryness. Caretaker sts pt normally given visine eye drops but does did not bring with her. Abigail PA made aware and pt given saline bullet to lubricate.

## 2013-05-08 NOTE — ED Notes (Signed)
Pt denies pain, c/o bright red blood in toilet this morning. Pt sts rectum is nontender. Pt resting

## 2013-05-08 NOTE — H&P (Signed)
[facility] History and Physical  Quenna Doepke ZOX:[id number] DOB: 05-08-1908 DOA: 05/08/2013  Referring physician:  PCP: Georgianne Fick, MD  Specialists:   Chief Complaint: GI bleeding   HPI: Glenda Rogers is a 79 y.o. female with PMH of hypothyroidism, ? CVA presented with GI bleeding; Caretaker states that this morning she noticed blood on the back of her hands, later patient had large bloody BM; denies abdominal pain, no dizziness, no nausea vomiting; no chest pain, no SOB;   Review of Systems: The patient denies anorexia, fever, weight loss,, vision loss, decreased hearing, hoarseness, chest pain, syncope, dyspnea on exertion, peripheral edema, balance deficits, hemoptysis, hematuria, incontinence, genital sores, muscle weakness, suspicious skin lesions, transient blindness, difficulty walking, depression, unusual weight change, abnormal bleeding, enlarged lymph nodes, angioedema, and breast masses.    Past Medical History  Diagnosis Date  . Hypothyroidism   . CVA (cerebral infarction)   . Arthritis   . Seasonal allergies   . H/O colonoscopy with polypectomy 2001    adenomatous without HGD  . Dysphagia 2012    MBSS with diminished esophageal peristalsis and esophageal dysphagia.   Past Surgical History  Procedure Laterality Date  . Fracture surgery      pelvis  . Thyroidectomy      hx goiter  . Kyphoplasty  2009    L3.  to address compression fracture   Social History:  reports that she has never smoked. She has never used smokeless tobacco. She reports that she does not drink alcohol or use illicit drugs. Home: where does patient live--home, ALF, SNF? and with whom if at home? No: Can patient participate in ADLs?  Allergies  Allergen Reactions  . Amoxicillin Rash    Family History  Problem Relation Age of Onset  . Other Mother     pneumonia  . Colon cancer Neg Hx     (be sure to complete)  Prior to Admission medications   Medication Sig Start Date  End Date Taking? Authorizing Provider  ALPHAGAN P 0.1 % SOLN Place 1 drop into the left eye 2 (two) times daily.  04/23/13  Yes Historical Provider, MD  aspirin EC 81 MG tablet Take 81 mg by mouth daily.   Yes Historical Provider, MD  b complex vitamins tablet Take 1 tablet by mouth daily.   Yes Historical Provider, MD  Calcium Carbonate-Vitamin D (CALCIUM 600 + D PO) Take 1 tablet by mouth daily.    Yes Historical Provider, MD  cephALEXin (KEFLEX) 500 MG capsule Take 500 mg by mouth 2 (two) times daily. For 10 days 04/27/13  Yes Historical Provider, MD  clopidogrel (PLAVIX) 75 MG tablet Take 75 mg by mouth daily with breakfast.   Yes Historical Provider, MD  SYNTHROID 50 MCG tablet Take 1 tablet by mouth Daily. 09/01/10  Yes Historical Provider, MD  tobramycin (TOBREX) 0.3 % ophthalmic solution Place 1 drop into the left eye at bedtime.  04/23/13  Yes Historical Provider, MD  TRAVATAN Z 0.004 % ophthalmic solution Place 2 drops into both eyes Daily. 12/21/10  Yes Historical Provider, MD   Physical Exam: Filed Vitals:   05/08/13 1230  BP: 159/68  Pulse: 65  Temp:   Resp: 16     General:  alert  Eyes: EOM-I  ENT: no oral ulcers   Neck: supple   Cardiovascular: s1,s2 rrr; systolic mr  Respiratory: CTA BL  Abdomen: soft, nt, nd  Skin: echymosis   Musculoskeletal: no LE edema  Psychiatric: no hallucinations   Neurologic:  CN 2-12 intact; motor 5/5 intact   Labs on Admission:  Basic Metabolic Panel:  Recent Labs Lab 05/08/13 1022  NA 140  K 5.0  CL 104  CO2 29  GLUCOSE 97  BUN 15  CREATININE 0.96  CALCIUM 9.0   Liver Function Tests: No results found for this basename: AST, ALT, ALKPHOS, BILITOT, PROT, ALBUMIN,  in the last 168 hours No results found for this basename: LIPASE, AMYLASE,  in the last 168 hours No results found for this basename: AMMONIA,  in the last 168 hours CBC:  Recent Labs Lab 05/08/13 1022  WBC 5.8  HGB 13.8  HCT 41.2  MCV 95.4  PLT  141*   Cardiac Enzymes: No results found for this basename: CKTOTAL, CKMB, CKMBINDEX, TROPONINI,  in the last 168 hours  BNP (last 3 results) No results found for this basename: PROBNP,  in the last 8760 hours CBG: No results found for this basename: GLUCAP,  in the last 168 hours  Radiological Exams on Admission: No results found.  EKG: Independently reviewed. Not done   Assessment/Plan Principal Problem:   GI bleed Active Problems:   Hypothyroidism   CVA (cerebral infarction)  79 y.o. female with PMH of hypothyroidism, ? CVA presented with GI bleeding; Caretaker states that this morning she noticed blood on the back of her hands, later patient had large bloody BM; -patient is on plavix ? Reason; possible h/o CVA   1. GIB likely diverticular bleeding; BP stable;  -monitor SDU due to active bleeding; type screen; TF rpn; hold antiplatelets; NPO; gentle IVF;  c/s GI may need endoscopy  -patient is on ASA+plavix ? Probable h/o CVA (friend/caregiver not aware about h/o CVA); hold all antiplatelets with GIB   2. Hypothyroidism; cont home regimen    D/w patient, friend, caregiver at the bedside;  -patient is DNR; d/w patient confirmed   GI;  if consultant consulted, please document name and whether formally or informally consulted  Code Status: DNR (must indicate code status--if unknown or must be presumed, indicate so) Family Communication: friend, caregiver at thebedside (indicate person spoken with, if applicable, with phone number if by telephone) Disposition Plan: home when ready (indicate anticipated LOS)  Time spent: >45 minutes   Esperanza Sheets [facility] Pager [phone]  If 7PM-7AM, please contact night-coverage [email or web address] Password [email or web address] 05/08/2013, 1:04 PM

## 2013-05-08 NOTE — ED Notes (Signed)
RN attempted report 

## 2013-05-08 NOTE — ED Notes (Signed)
Visitor reports that pt has rectal bleeding that started at some time during the night. Visitor helped pt to bathroom at 0700 and noticed her brief was saturated with dark red blood. Pt has not complained of dizziness, abdominal pain or nausea/vomiting.

## 2013-05-08 NOTE — ED Provider Notes (Signed)
CSN: [id number]     Arrival date & time 05/08/13  4098 History   First MD Initiated Contact with Patient 05/08/13 1000     Chief Complaint  Patient presents with  . Rectal Bleeding   (Consider location/radiation/quality/duration/timing/severity/associated sxs/prior Treatment) HPI This is a 79 year old female brought in by her caretaker and a family member for rectal bleeding.  Caretaker states that this morning she noticed blood on the back of her hands.  She states that she removed her adult diaper and found part bloody stool.  When she is to rest drink she had a large bloody bowel movement.  She states that the patient would also beat blood with flatus.  She said this has not happened before.  Denies any hematemesis.  The patient has not had any abdominal pain.  The patient has a past medical history of CVA a.m. Currently takes Plavix.  She is also on thyroid medication for previous thyroidectomy.  She is an otherwise healthy female.  She does have an undiagnosed heart murmur however it was decided by she and her primary care physician that they would not have this worked up at this time.  Review of medical records shows no previous report of endoscopies.  Patient has seen Dr. Melvia Heaps previously for a swallow study which was canceled. She is otherwise alert healthy 79 year old female without dementia.  Review of chart shows full code as of 2013.  Past Medical History  Diagnosis Date  . Hypothyroidism   . CVA (cerebral infarction)   . Arthritis   . Allergy     seasonal   Past Surgical History  Procedure Laterality Date  . Fracture surgery      pelvis  . Thyroidectomy     Family History  Problem Relation Age of Onset  . Other Mother     pneumonia  . Colon cancer Neg Hx    History  Substance Use Topics  . Smoking status: Never Smoker   . Smokeless tobacco: Never Used  . Alcohol Use: No   OB History   Grav Para Term Preterm Abortions TAB SAB Ect Mult Living                  Review of Systems  Constitutional: Negative for fever, activity change, appetite change and unexpected weight change.  HENT: Positive for trouble swallowing (chronic).   Eyes: Positive for photophobia (due tot chronically irritatted eyes.).  Respiratory: Negative for shortness of breath and wheezing.   Cardiovascular: Negative for chest pain.  Gastrointestinal: Positive for blood in stool and anal bleeding. Negative for vomiting, abdominal pain and rectal pain.  Genitourinary: Negative for dysuria.  Musculoskeletal: Negative for neck stiffness.  Skin: Negative for rash and wound.  Hematological: Negative for adenopathy. Bruises/bleeds easily.    Allergies  Amoxicillin  Home Medications   Current Outpatient Rx  Name  Route  Sig  Dispense  Refill  . aspirin 325 MG tablet   Oral   Take 325 mg by mouth daily.           . Calcium Carbonate-Vitamin D (CALCIUM 600 + D PO)   Oral   Take 1 tablet by mouth daily.          . cephALEXin (KEFLEX) 250 MG capsule   Oral   Take 1 capsule (250 mg total) by mouth 3 (three) times daily.   30 capsule   0   . silver sulfADIAZINE (SILVADENE) 1 % cream   Topical   Apply topically  daily.   50 g   0   . SYNTHROID 50 MCG tablet   Oral   Take 1 tablet by mouth Daily.         . TRAVATAN Z 0.004 % ophthalmic solution   Both Eyes   Place 2 drops into both eyes Daily.          BP 174/78  Pulse 74  Temp(Src) 98.1 F (36.7 C) (Oral)  Resp 12  SpO2 95% Physical Exam  Vitals reviewed. Constitutional: She is oriented to person, place, and time. She appears well-developed and well-nourished. No distress.  HENT:  Head: Normocephalic and atraumatic.  Eyes: Conjunctivae are normal. No scleral icterus.  Irritated, injected eyes and surrounding lids. No mattering or discharge.   Neck: Normal range of motion.  Cardiovascular: Normal rate and regular rhythm.   Murmur (loud, cresendo-decrescendo holosytolic murmur heard lbest at 1st  ICSMCL.) heard. Pulmonary/Chest: Effort normal and breath sounds normal. No respiratory distress.  Abdominal: Soft. Bowel sounds are normal. She exhibits no distension and no mass. There is no tenderness. There is no guarding.  Genitourinary:  Digital Rectal Exam: Gross, Dark and red blood present in clothing and around the rectal tissue. reveals sphincter with good tone. No external hemorrhoids. No masses or fissures palpated. Stool color is melanotic and bright red.   Neurological: She is alert and oriented to person, place, and time.  Skin: Skin is warm and dry. She is not diaphoretic.   [facility] ED Course  Procedures (including critical care time) Labs Review Labs Reviewed  CBC  BASIC METABOLIC PANEL  PROTIME-INR   Imaging Review No results found.  EKG Interpretation   None       MDM   1. Rectal bleed   2. GI bleed   3. Hypothyroidism    Patient with Normal HGB. I suspect this is mostly a Lower GI bleed mixed with stool and less likely upper GI. Platelet count is slightly low. cartaker called pateitn PCP Dr. Nicholos Johns this morning and was advised to withold anticoagulants    Patient with risks GI bleed however there is no drop in her hemoglobin.  Her labs look otherwise normal.  A consult of Dr.Buriev will admit the patient to step down unit.  [facility] is aware of the patient and will consult the hospital.  Discussed this with the patient who agrees to plan of care. The patient appears reasonably stabilized for admission considering the current resources, flow, and capabilities available in the ED at this time, and I doubt any other [facility] requiring further screening and/or treatment in the ED prior to admission.   Arthor Captain, PA-C 05/08/13 1623

## 2013-05-09 LAB — CBC
HCT: 36.1 % (ref 36.0–46.0)
HCT: 36.4 % (ref 36.0–46.0)
Hemoglobin: 12 g/dL (ref 12.0–15.0)
Hemoglobin: 12.4 g/dL (ref 12.0–15.0)
MCH: 32 pg (ref 26.0–34.0)
MCH: 32.4 pg (ref 26.0–34.0)
MCHC: 33.2 g/dL (ref 30.0–36.0)
MCHC: 34.1 g/dL (ref 30.0–36.0)
MCV: 96.3 fL (ref 78.0–100.0)
MCV: 95 fL (ref 78.0–100.0)
Platelets: 128 10*3/uL — ABNORMAL LOW (ref 150–400)
RBC: 3.75 MIL/uL — ABNORMAL LOW (ref 3.87–5.11)
RBC: 3.83 MIL/uL — ABNORMAL LOW (ref 3.87–5.11)
RDW: 14.3 % (ref 11.5–15.5)
RDW: 14.3 % (ref 11.5–15.5)

## 2013-05-09 NOTE — Progress Notes (Signed)
[facility] PROGRESS NOTE  Glenda Rogers WUJ:[id number] DOB: [birth year] DOA: 05/08/2013 PCP: Georgianne Fick, MD  Assessment/Plan: 79 y.o. female with PMH of hypothyroidism, ? CVA presented with GI bleeding; Caretaker states that this morning she noticed blood on the back of her hands, later patient had large bloody BM;  -patient is on plavix ? Reason; possible h/o CVA   1. GIB likely diverticular bleeding;  hemodynamically stable;   -Hg mild drop, but stable; had another episode of mild GIB; TF rpn; hold antiplatelets;  -advance diet; not a candidate for colonoscopy per GI, may need flex sig if bleeds;   -d/c ASA+plavix  With GIB   2. Hypothyroidism; cont home regimen   TF to RNF   D/w patient, friend, caregiver at the bedside;  -patient is DNR; d/w patient confirmed      Code Status: DNR Family Communication: caregiver at the bedside (indicate person spoken with, relationship, and if by phone, the number) Disposition Plan: home in 24-48 hours   Consultants:  GI  Procedures:  None   Antibiotics:  None  (indicate start date, and stop date if known)  HPI/Subjective: alert  Objective: Filed Vitals:   05/09/13 0800  BP:   Pulse:   Temp: 97.8 F (36.6 C)  Resp:     Intake/Output Summary (Last 24 hours) at 05/09/13 0937 Last data filed at 05/08/13 1900  Gross per 24 hour  Intake    130 ml  Output      0 ml  Net    130 ml   Filed Weights   05/08/13 1700  Weight: 55.9 kg (123 lb 3.8 oz)    Exam:   General:  alert  Cardiovascular: s1,s2 rrr  Respiratory: CTA BL  Abdomen: soft, nt, nd   Musculoskeletal: No LE edema   Data Reviewed: Basic Metabolic Panel:  Recent Labs Lab 05/08/13 1022  NA 140  K 5.0  CL 104  CO2 29  GLUCOSE 97  BUN 15  CREATININE 0.96  CALCIUM 9.0   Liver Function Tests: No results found for this basename: AST, ALT, ALKPHOS, BILITOT, PROT, ALBUMIN,  in the last 168 hours No results found for this  basename: LIPASE, AMYLASE,  in the last 168 hours No results found for this basename: AMMONIA,  in the last 168 hours CBC:  Recent Labs Lab 05/08/13 1022 05/08/13 1830 05/09/13 0732  WBC 5.8 7.3 7.9  HGB 13.8 12.4 12.0  HCT 41.2 37.1 36.1  MCV 95.4 95.9 96.3  PLT 141* 122* 121*   Cardiac Enzymes: No results found for this basename: CKTOTAL, CKMB, CKMBINDEX, TROPONINI,  in the last 168 hours BNP (last 3 results) No results found for this basename: PROBNP,  in the last 8760 hours CBG: No results found for this basename: GLUCAP,  in the last 168 hours  Recent Results (from the past 240 hour(s))  MRSA PCR SCREENING     Status: None   Collection Time    05/08/13  5:03 PM      Result Value Range Status   MRSA by PCR NEGATIVE  NEGATIVE Final   Comment:            The GeneXpert MRSA Assay (FDA     approved for NASAL specimens     only), is one component of a     comprehensive MRSA colonization     surveillance program. It is not     intended to diagnose MRSA     infection nor to guide or  monitor treatment for     MRSA infections.     Studies: No results found.  Scheduled Meds: . brimonidine  1 drop Left Eye BID  . latanoprost  1 drop Both Eyes QHS  . levothyroxine  50 mcg Oral QAC breakfast  . sodium chloride  3 mL Intravenous Q12H  . tobramycin  1 drop Left Eye QHS   Continuous Infusions: . dextrose 5 % and 0.9% NaCl 1,000 mL (05/09/13 0610)    Principal Problem:   GI bleed Active Problems:   Hypothyroidism   CVA (cerebral infarction)    Time spent: >35 minutes     Esperanza Sheets  [facility] Pager [phone]. If 7PM-7AM, please contact night-coverage at [email or web address], password [facility] 05/09/2013, 9:37 AM  LOS: 1 day

## 2013-05-09 NOTE — Progress Notes (Signed)
   Subjective  No complaints, had severe small bloody stools as reported by her care taker   Objective   Vital signs in last 24 hours: Temp:  [97.8 F (36.6 C)-98.6 F (37 C)] 97.8 F (36.6 C) (12/06 0800) Pulse Rate:  [56-81] 56 (12/06 0751) Resp:  [12-22] 21 (12/06 0751) BP: (134-174)/(51-85) 136/63 mmHg (12/06 0751) SpO2:  [94 %-99 %] 97 % (12/06 0751) Weight:  [123 lb 3.8 oz (55.9 kg)] 123 lb 3.8 oz (55.9 kg) (12/05 1700) Last BM Date: 05/08/13 General:    white female in NAD Heart:  Regular rate and rhythm; no murmurs Lungs: Respirations even and unlabored, lungs CTA bilaterally Abdomen:  Soft, nontender and nondistended. Normal bowel sounds. Extremities:  Without edema. Neurologic:  Alert and oriented,  grossly normal neurologically. Psych:  Cooperative. Normal mood and affect.  Intake/Output from previous day: 12/05 0701 - 12/06 0700 In: 130 [I.V.:130] Out: -  Intake/Output this shift:    Lab Results:  Recent Labs  05/08/13 1022 05/08/13 1830 05/09/13 0732  WBC 5.8 7.3 7.9  HGB 13.8 12.4 12.0  HCT 41.2 37.1 36.1  PLT 141* 122* 121*   BMET  Recent Labs  05/08/13 1022  NA 140  K 5.0  CL 104  CO2 29  GLUCOSE 97  BUN 15  CREATININE 0.96  CALCIUM 9.0   LFT No results found for this basename: PROT, ALBUMIN, AST, ALT, ALKPHOS, BILITOT, BILIDIR, IBILI,  in the last 72 hours PT/INR  Recent Labs  05/08/13 1022  LABPROT 13.7  INR 1.07    Studies/Results: No results found.     Assessment / Plan:   Acute LGIB appears hemodynamically stable despite of having small bloody stools. The Hgb has not dropped significantly , abdominal eaxam is unremarkable. She is not a candidate for colonoscopy but is she continues to pass fresh blood we will consider flexible sigmoidoscopy to r/o anorectal source. OK to transfer to regular unit and follow H/H. OK to advance to full liquids.  Principal Problem:   GI bleed Active Problems:   Hypothyroidism   CVA  (cerebral infarction)     LOS: 1 day   Lina Sar  05/09/2013, 9:14 AM

## 2013-05-09 NOTE — Progress Notes (Signed)
Utilization Review completed.  

## 2013-05-09 NOTE — Progress Notes (Addendum)
Report called to Benedict, RN 5W M/S. VSS. Pt. Continuing to have small amount frank red stool. eICU and CCMT notified. Will transfer by bed. All belongings sent with patient. Will continue to monitor until time of transfer. Family aware of transfer to new location.   Pt. Transferred at 1335 by RN and NT via bed. All belongings sent with patient. 24 hour home sitter at bedside.

## 2013-05-10 DIAGNOSIS — I6350 Cerebral infarction due to unspecified occlusion or stenosis of unspecified cerebral artery: Secondary | ICD-10-CM

## 2013-05-10 LAB — CBC
MCHC: 34 g/dL (ref 30.0–36.0)
MCV: 94.3 fL (ref 78.0–100.0)
Platelets: 129 10*3/uL — ABNORMAL LOW (ref 150–400)
RBC: 3.71 MIL/uL — ABNORMAL LOW (ref 3.87–5.11)
RDW: 14.1 % (ref 11.5–15.5)
WBC: 6.7 10*3/uL (ref 4.0–10.5)

## 2013-05-10 NOTE — Progress Notes (Signed)
[facility] PROGRESS NOTE  Glenda Rogers ZOX:[id number] DOB: 1907/10/23 DOA: 05/08/2013 PCP: Georgianne Fick, MD  Assessment/Plan: 79 y.o. female with PMH of hypothyroidism, ? CVA presented with GI bleeding; Caretaker states that this morning she noticed blood on the back of her hands, later patient had large bloody BM;  -patient is on plavix ? Reason; possible h/o CVA   1. GIB likely diverticular bleeding;  hemodynamically stable;   -Hg mild drop, but stable; had another episode of mild GIB; likely passing left over blood; TF rpn; hold antiplatelets;  -advance diet; not a candidate for colonoscopy per GI, may need flex sig if bleeds;   -d/c ASA+plavix  With GIB   2. Hypothyroidism; cont home regimen   D/c home likely on 12/8   D/w patient, friend, caregiver at the bedside;  -patient is DNR; d/w patient confirmed      Code Status: DNR Family Communication: caregiver at the bedside (indicate person spoken with, relationship, and if by phone, the number) Disposition Plan: home in 24-48 hours   Consultants:  GI  Procedures:  None   Antibiotics:  None  (indicate start date, and stop date if known)  HPI/Subjective: alert  Objective: Filed Vitals:   05/10/13 0623  BP: 126/64  Pulse: 73  Temp: 97.8 F (36.6 C)  Resp: 18    Intake/Output Summary (Last 24 hours) at 05/10/13 1042 Last data filed at 05/09/13 1250  Gross per 24 hour  Intake    240 ml  Output      0 ml  Net    240 ml   Filed Weights   05/08/13 1700 05/09/13 1339  Weight: 55.9 kg (123 lb 3.8 oz) 55.9 kg (123 lb 3.8 oz)    Exam:   General:  alert  Cardiovascular: s1,s2 rrr  Respiratory: CTA BL  Abdomen: soft, nt, nd   Musculoskeletal: No LE edema   Data Reviewed: Basic Metabolic Panel:  Recent Labs Lab 05/08/13 1022  NA 140  K 5.0  CL 104  CO2 29  GLUCOSE 97  BUN 15  CREATININE 0.96  CALCIUM 9.0   Liver Function Tests: No results found for this basename: AST,  ALT, ALKPHOS, BILITOT, PROT, ALBUMIN,  in the last 168 hours No results found for this basename: LIPASE, AMYLASE,  in the last 168 hours No results found for this basename: AMMONIA,  in the last 168 hours CBC:  Recent Labs Lab 05/08/13 1022 05/08/13 1830 05/09/13 0732 05/09/13 1804 05/10/13 0756  WBC 5.8 7.3 7.9 7.7 6.7  HGB 13.8 12.4 12.0 12.4 11.9*  HCT 41.2 37.1 36.1 36.4 35.0*  MCV 95.4 95.9 96.3 95.0 94.3  PLT 141* 122* 121* 128* 129*   Cardiac Enzymes: No results found for this basename: CKTOTAL, CKMB, CKMBINDEX, TROPONINI,  in the last 168 hours BNP (last 3 results) No results found for this basename: PROBNP,  in the last 8760 hours CBG: No results found for this basename: GLUCAP,  in the last 168 hours  Recent Results (from the past 240 hour(s))  MRSA PCR SCREENING     Status: None   Collection Time    05/08/13  5:03 PM      Result Value Range Status   MRSA by PCR NEGATIVE  NEGATIVE Final   Comment:            The GeneXpert MRSA Assay (FDA     approved for NASAL specimens     only), is one component of a     comprehensive  MRSA colonization     surveillance program. It is not     intended to diagnose MRSA     infection nor to guide or     monitor treatment for     MRSA infections.     Studies: No results found.  Scheduled Meds: . brimonidine  1 drop Left Eye BID  . latanoprost  1 drop Both Eyes QHS  . levothyroxine  50 mcg Oral QAC breakfast  . sodium chloride  3 mL Intravenous Q12H  . tobramycin  1 drop Left Eye QHS   Continuous Infusions:    Principal Problem:   GI bleed Active Problems:   Hypothyroidism   CVA (cerebral infarction)    Time spent: >35 minutes     Esperanza Sheets  [facility] Pager [phone]. If 7PM-7AM, please contact night-coverage at [email or web address], password [facility] 05/10/2013, 10:42 AM  LOS: 2 days

## 2013-05-10 NOTE — Progress Notes (Signed)
   Subjective  No complaints, small dark stool this am,    Objective   Vital signs in last 24 hours: Temp:  [97.7 F (36.5 C)-98.4 F (36.9 C)] 97.8 F (36.6 C) (12/07 0623) Pulse Rate:  [58-76] 73 (12/07 0623) Resp:  [18] 18 (12/07 0623) BP: (126-142)/(50-69) 126/64 mmHg (12/07 0623) SpO2:  [94 %-97 %] 95 % (12/07 0623) Weight:  [123 lb 3.8 oz (55.9 kg)] 123 lb 3.8 oz (55.9 kg) (12/06 1339) Last BM Date: 05/10/13 General:    white female in NAD Heart:  Regular rate and rhythm; no murmurs Lungs: Respirations even and unlabored, lungs CTA bilaterally Abdomen:  Soft, nontender and nondistended. Normal bowel sounds. Extremities:  Without edema. Neurologic:    grossly normal neurologically. Psych:  Cooperative. Normal mood and affect.  Intake/Output from previous day: 12/06 0701 - 12/07 0700 In: 768 [P.O.:540; I.V.:228] Out: -  Intake/Output this shift:    Lab Results:  Recent Labs  05/09/13 0732 05/09/13 1804 05/10/13 0756  WBC 7.9 7.7 6.7  HGB 12.0 12.4 11.9*  HCT 36.1 36.4 35.0*  PLT 121* 128* 129*   BMET  Recent Labs  05/08/13 1022  NA 140  K 5.0  CL 104  CO2 29  GLUCOSE 97  BUN 15  CREATININE 0.96  CALCIUM 9.0   LFT No results found for this basename: PROT, ALBUMIN, AST, ALT, ALKPHOS, BILITOT, BILIDIR, IBILI,  in the last 72 hours PT/INR  Recent Labs  05/08/13 1022  LABPROT 13.7  INR 1.07    Studies/Results: No results found.     Assessment / Plan:   S/p LGIB  Probably diverticular, stable, Hgb stable at 11.9, no plans for colonoscopy at her age. Off Plavix/ permanently Will advance diet, possibly home tomorrow  Principal Problem:   GI bleed Active Problems:   Hypothyroidism   CVA (cerebral infarction)     LOS: 2 days   Lina Sar  05/10/2013, 10:59 AM

## 2013-05-10 NOTE — Progress Notes (Signed)
Pt had BM while up to [facility]  Stool was somewhat formed and very dark red, maroon colored.  Caregiver at bedside states the stool early this am looked just like it does now (dark red).

## 2013-05-11 LAB — CBC
HCT: 34.1 % — ABNORMAL LOW (ref 36.0–46.0)
Hemoglobin: 11.4 g/dL — ABNORMAL LOW (ref 12.0–15.0)
MCH: 32 pg (ref 26.0–34.0)
MCHC: 33.4 g/dL (ref 30.0–36.0)
Platelets: 122 10*3/uL — ABNORMAL LOW (ref 150–400)
RBC: 3.56 MIL/uL — ABNORMAL LOW (ref 3.87–5.11)
WBC: 6.3 10*3/uL (ref 4.0–10.5)

## 2013-05-11 LAB — OCCULT BLOOD, POC DEVICE: Fecal Occult Bld: POSITIVE — AB

## 2013-05-11 MED ORDER — ACETAMINOPHEN 325 MG PO TABS: 650.0000 mg | ORAL_TABLET | Freq: Four times a day (QID) | ORAL | Status: AC | PRN

## 2013-05-11 NOTE — Care Management Note (Signed)
    Page 1 of 1   05/11/2013     11:56:26 AM   CARE MANAGEMENT NOTE 05/11/2013  Patient:  Scarbrough,Unice   Account Number:  [id number]  Date Initiated:  05/11/2013  Documentation initiated by:  Letha Cape  Subjective/Objective Assessment:   dx  gib  admit- lives alone and has 24 hr care( private duty)     Action/Plan:   Anticipated DC Date:  05/11/2013   Anticipated DC Plan:  HOME W HOME HEALTH SERVICES      DC Planning Services  CM consult      [facility] Choice  HOME HEALTH   Choice offered to / List presented to:  C-1 Patient        HH arranged  HH-2 PT      HH agency  [facility]   Status of service:  Completed, signed off Medicare Important Message given?   (If response is "NO", the following Medicare IM given date fields will be blank) Date Medicare IM given:   Date Additional Medicare IM given:    Discharge Disposition:  HOME W HOME HEALTH SERVICES  Per UR Regulation:  Reviewed for med. necessity/level of care/duration of stay  If discussed at Long Length of Stay Meetings, dates discussed:    Comments:  05/11/13 11:49 Letha Cape RN, BSN [phone] patient is for dc today, she has 24 hr care around the clock .  Patient and care giver chose [facility] from agency list, for HHPT, referral maed to [facility], Marie notified.  Soc will begin 24-48 hrs post discharge. Care states she will transport patient home in [place] and patient has a w/chair.

## 2013-05-11 NOTE — Progress Notes (Signed)
Glenda Rogers to be D/C'd Home per MD order.  Discussed with the patient and all questions fully answered.    Medication List    STOP taking these medications       aspirin EC 81 MG tablet     cephALEXin 500 MG capsule  Commonly known as:  KEFLEX     clopidogrel 75 MG tablet  Commonly known as:  PLAVIX      TAKE these medications       acetaminophen 325 MG tablet  Commonly known as:  TYLENOL  Take 2 tablets (650 mg total) by mouth every 6 (six) hours as needed for mild pain (or Fever >/= 101).     ALPHAGAN P 0.1 % Soln  Generic drug:  brimonidine  Place 1 drop into the left eye 2 (two) times daily.     b complex vitamins tablet  Take 1 tablet by mouth daily.     CALCIUM 600 + D PO  Take 1 tablet by mouth daily.     SYNTHROID 50 MCG tablet  Generic drug:  levothyroxine  Take 1 tablet by mouth Daily.     tobramycin 0.3 % ophthalmic solution  Commonly known as:  TOBREX  Place 1 drop into the left eye at bedtime.     TRAVATAN Z 0.004 % Soln ophthalmic solution  Generic drug:  Travoprost (BAK Free)  Place 2 drops into both eyes Daily.        VVS, Skin clean, dry and intact without evidence of skin break down, no evidence of skin tears noted. IV catheter discontinued intact. Site without signs and symptoms of complications. Dressing and pressure applied.  An After Visit Summary was printed and given to the patient.  D/c education completed with patient/family including follow up instructions, medication list, d/c activities limitations if indicated, with other d/c instructions as indicated by MD - patient able to verbalize understanding, all questions fully answered.   Patient instructed to return to ED, call 911, or call MD for any changes in condition.   Patient escorted via WC, and D/C home via private auto.  [name] 05/11/2013 12:41 PM

## 2013-05-11 NOTE — Discharge Summary (Signed)
Physician Discharge Summary  [place]arest WUJ:[id number] DOB: Nov 07, 1907 DOA: 05/08/2013  PCP: Georgianne Fick, MD  Admit date: 05/08/2013 Discharge date: 05/11/2013  Time spent: >35  minutes  Recommendations for Outpatient Follow-up:  HHC F/u with PCP in 1-2 weeks as needed  Discharge Diagnoses:  Principal Problem:   GI bleed Active Problems:   Hypothyroidism   CVA (cerebral infarction)   Discharge Condition: stable   Diet recommendation: heart healthy   Filed Weights   05/08/13 1700 05/09/13 1339  Weight: 55.9 kg (123 lb 3.8 oz) 55.9 kg (123 lb 3.8 oz)    History of present illness:  79 y.o. female with PMH of hypothyroidism, ? CVA presented with GI bleeding; Caretaker states that this morning she noticed blood on the back of her hands, later patient had large bloody BM;  -patient is on plavix ? Reason; possible h/o CVA   Hospital Course:  1. GIB likely diverticular bleeding; hemodynamically stable; Hg stable; she did not receive  TF -bleeding resolving; no dizziness; Hg stable; d/c antiplatelets;  -Patient was seen by GI who thought that she is not a candidate for colonoscopy due to advanced age 75. Hypothyroidism; cont home regimen   D/w patient, friend, caregiver at the bedside;  -patient is DNR; d/w patient confirmed    Procedures:  None  (i.e. Studies not automatically included, echos, thoracentesis, etc; not x-rays)  Consultations:  GI  Discharge Exam: Filed Vitals:   05/11/13 0442  BP: 124/70  Pulse: 68  Temp: 98.7 F (37.1 C)  Resp: 18    General: alert Cardiovascular: s1,s2 rrr Respiratory: CTA BL  Discharge Instructions  Discharge Orders   Future Appointments Provider Department Dept Phone   05/20/2013 2:30 PM Marlowe Aschoff, DPM [facility] [phone]   Future Orders Complete By Expires   Diet - low sodium heart healthy  As directed    Discharge instructions  As directed    Comments:     Please follow up  with primary care doctor in 1 week   Increase activity slowly  As directed        Medication List    STOP taking these medications       aspirin EC 81 MG tablet     cephALEXin 500 MG capsule  Commonly known as:  KEFLEX     clopidogrel 75 MG tablet  Commonly known as:  PLAVIX      TAKE these medications       acetaminophen 325 MG tablet  Commonly known as:  TYLENOL  Take 2 tablets (650 mg total) by mouth every 6 (six) hours as needed for mild pain (or Fever >/= 101).     ALPHAGAN P 0.1 % Soln  Generic drug:  brimonidine  Place 1 drop into the left eye 2 (two) times daily.     b complex vitamins tablet  Take 1 tablet by mouth daily.     CALCIUM 600 + D PO  Take 1 tablet by mouth daily.     SYNTHROID 50 MCG tablet  Generic drug:  levothyroxine  Take 1 tablet by mouth Daily.     tobramycin 0.3 % ophthalmic solution  Commonly known as:  TOBREX  Place 1 drop into the left eye at bedtime.     TRAVATAN Z 0.004 % Soln ophthalmic solution  Generic drug:  Travoprost (BAK Free)  Place 2 drops into both eyes Daily.       Allergies  Allergen Reactions  . Amoxicillin Rash  Follow-up Information   Follow up with [facility], MD In 1 week.   Specialty:  Internal Medicine   Contact information:   [address] [place] [address] [phone]        The results of significant diagnostics from this hospitalization (including imaging, microbiology, ancillary and laboratory) are listed below for reference.    Significant Diagnostic Studies: No results found.  Microbiology: Recent Results (from the past 240 hour(s))  MRSA PCR SCREENING     Status: None   Collection Time    05/08/13  5:03 PM      Result Value Range Status   MRSA by PCR NEGATIVE  NEGATIVE Final   Comment:            The GeneXpert MRSA Assay (FDA     approved for NASAL specimens     only), is one component of a     comprehensive MRSA colonization     surveillance  program. It is not     intended to diagnose MRSA     infection nor to guide or     monitor treatment for     MRSA infections.     Labs: Basic Metabolic Panel:  Recent Labs Lab 05/08/13 1022  NA 140  K 5.0  CL 104  CO2 29  GLUCOSE 97  BUN 15  CREATININE 0.96  CALCIUM 9.0   Liver Function Tests: No results found for this basename: AST, ALT, ALKPHOS, BILITOT, PROT, ALBUMIN,  in the last 168 hours No results found for this basename: LIPASE, AMYLASE,  in the last 168 hours No results found for this basename: AMMONIA,  in the last 168 hours CBC:  Recent Labs Lab 05/08/13 1830 05/09/13 0732 05/09/13 1804 05/10/13 0756 05/11/13 0620  WBC 7.3 7.9 7.7 6.7 6.3  HGB 12.4 12.0 12.4 11.9* 11.4*  HCT 37.1 36.1 36.4 35.0* 34.1*  MCV 95.9 96.3 95.0 94.3 95.8  PLT 122* 121* 128* 129* 122*   Cardiac Enzymes: No results found for this basename: CKTOTAL, CKMB, CKMBINDEX, TROPONINI,  in the last 168 hours BNP: BNP (last 3 results) No results found for this basename: PROBNP,  in the last 8760 hours CBG: No results found for this basename: GLUCAP,  in the last 168 hours     Signed:  Esperanza Sheets  [facility] 05/11/2013, 11:31 AM

## 2013-05-18 NOTE — ED Provider Notes (Signed)
I saw and evaluated the patient, reviewed the resident's note and I agree with the findings and plan.  EKG Interpretation    Date/Time:    Ventricular Rate:    PR Interval:    QRS Duration:   QT Interval:    QTC Calculation:   R Axis:     Text Interpretation:                Roney Marion, MD 05/18/13 2302

## 2013-05-20 ENCOUNTER — Ambulatory Visit: Payer: Medicare Other | Admitting: Podiatrist

## 2013-06-11 ENCOUNTER — Ambulatory Visit: Payer: Medicare Other | Admitting: Podiatrist

## 2013-06-25 ENCOUNTER — Ambulatory Visit (INDEPENDENT_AMBULATORY_CARE_PROVIDER_SITE_OTHER): Payer: Medicare Other | Admitting: Podiatrist

## 2013-06-25 DIAGNOSIS — B351 Tinea unguium: Secondary | ICD-10-CM

## 2013-06-25 DIAGNOSIS — M79609 Pain in unspecified limb: Secondary | ICD-10-CM

## 2013-06-25 NOTE — Progress Notes (Deleted)
   Subjective:    Patient ID: Glenda Rogers, female    DOB: Dec 01, 1907, [age] y.o.   MRN: [id number] Pt states she needs her toenails trimmed. HPI    Review of Systems  All other systems reviewed and are negative.       Objective:   Physical Exam        Assessment & Plan:

## 2013-06-25 NOTE — Progress Notes (Signed)
HPI:  Patient presents today for follow up of foot and nail care. Denies any new complaints today.  Objective:  Patients chart is reviewed.  Neurovascular status unchanged.  Patients nails are thickened, discolored, distrophic, friable and brittle with yellow-brown discoloration. Patient subjectively relates they are painful with shoes and with ambulation of bilateral feet.  Assessment:  Symptomatic onychomycosis  Plan:  Discussed treatment options and alternatives.  The symptomatic toenails were debrided through manual an mechanical means without complication.  Return appointment recommended at routine intervals of 3 months    Avaline Stillson, DPM   

## 2013-10-01 ENCOUNTER — Ambulatory Visit: Payer: Medicare Other | Admitting: Podiatrist

## 2014-09-03 DEATH — deceased
